# Patient Record
Sex: Male | Born: 1955 | Race: Black or African American | Hispanic: No | Marital: Single | Smoking: Former smoker
Health system: Southern US, Community
[De-identification: ages and names within clinical notes are randomized; demographics above are authoritative.]

## PROBLEM LIST (undated history)

## (undated) DIAGNOSIS — F1010 Alcohol abuse, uncomplicated: Secondary | ICD-10-CM

---

## 2015-07-14 ENCOUNTER — Emergency Department (HOSPITAL_COMMUNITY)
Admission: EM | Admit: 2015-07-14 | Discharge: 2015-07-15 | Disposition: A | Discharge status: Home / Self Care | Payer: Self-pay | Attending: Emergency Medicine | Admitting: Emergency Medicine

## 2015-07-14 ENCOUNTER — Encounter (HOSPITAL_COMMUNITY): Payer: Self-pay | Admitting: Oncology

## 2015-07-14 DIAGNOSIS — F1024 Alcohol dependence with alcohol-induced mood disorder: Secondary | ICD-10-CM | POA: Insufficient documentation

## 2015-07-14 DIAGNOSIS — F1094 Alcohol use, unspecified with alcohol-induced mood disorder: Secondary | ICD-10-CM | POA: Diagnosis present

## 2015-07-14 DIAGNOSIS — Z87891 Personal history of nicotine dependence: Secondary | ICD-10-CM | POA: Insufficient documentation

## 2015-07-14 DIAGNOSIS — R45850 Homicidal ideations: Secondary | ICD-10-CM

## 2015-07-14 DIAGNOSIS — F10230 Alcohol dependence with withdrawal, uncomplicated: Secondary | ICD-10-CM | POA: Diagnosis present

## 2015-07-14 DIAGNOSIS — R440 Auditory hallucinations: Secondary | ICD-10-CM | POA: Insufficient documentation

## 2015-07-14 DIAGNOSIS — F1010 Alcohol abuse, uncomplicated: Secondary | ICD-10-CM

## 2015-07-14 HISTORY — DX: Alcohol abuse, uncomplicated: F10.10

## 2015-07-14 LAB — RAPID URINE DRUG SCREEN, HOSP PERFORMED
AMPHETAMINES: NOT DETECTED
Barbiturates: NOT DETECTED
Benzodiazepines: NOT DETECTED
Cocaine: NOT DETECTED
OPIATES: NOT DETECTED
TETRAHYDROCANNABINOL: NOT DETECTED

## 2015-07-14 LAB — COMPREHENSIVE METABOLIC PANEL
ALBUMIN: 4.7 g/dL (ref 3.5–5.0)
ALT: 24 U/L (ref 17–63)
AST: 27 U/L (ref 15–41)
Alkaline Phosphatase: 83 U/L (ref 38–126)
Anion gap: 10 (ref 5–15)
BUN: 21 mg/dL — AB (ref 6–20)
CHLORIDE: 106 mmol/L (ref 101–111)
CO2: 27 mmol/L (ref 22–32)
CREATININE: 0.94 mg/dL (ref 0.61–1.24)
Calcium: 9.4 mg/dL (ref 8.9–10.3)
GFR calc non Af Amer: >60 mL/min (ref >60)
Glucose, Bld: 135 mg/dL — ABNORMAL HIGH (ref 65–99)
Potassium: 4 mmol/L (ref 3.5–5.1)
SODIUM: 143 mmol/L (ref 135–145)
Total Bilirubin: 0.8 mg/dL (ref 0.3–1.2)
Total Protein: 7.8 g/dL (ref 6.5–8.1)

## 2015-07-14 LAB — CBC
HCT: 39.3 % (ref 39.0–52.0)
HEMOGLOBIN: 13.3 g/dL (ref 13.0–17.0)
MCH: 30.6 pg (ref 26.0–34.0)
MCHC: 33.8 g/dL (ref 30.0–36.0)
MCV: 90.3 fL (ref 78.0–100.0)
Platelets: 270 10*3/uL (ref 150–400)
RBC: 4.35 MIL/uL (ref 4.22–5.81)
RDW: 13.1 % (ref 11.5–15.5)
WBC: 5 10*3/uL (ref 4.0–10.5)

## 2015-07-14 LAB — ACETAMINOPHEN LEVEL: Acetaminophen (Tylenol), Serum: <10 ug/mL — ABNORMAL LOW (ref 10–30)

## 2015-07-14 LAB — ETHANOL: Alcohol, Ethyl (B): 261 mg/dL — ABNORMAL HIGH (ref <5)

## 2015-07-14 LAB — SALICYLATE LEVEL

## 2015-07-14 MED ORDER — ZOLPIDEM TARTRATE 5 MG PO TABS: 5.0000 mg | ORAL_TABLET | Freq: Every evening | ORAL | Status: DC | PRN

## 2015-07-14 MED ORDER — IBUPROFEN 200 MG PO TABS: 600.0000 mg | ORAL_TABLET | Freq: Three times a day (TID) | ORAL | Status: DC | PRN

## 2015-07-14 MED ORDER — LORAZEPAM 1 MG PO TABS: 1.0000 mg | ORAL_TABLET | Freq: Three times a day (TID) | ORAL | Status: DC | PRN

## 2015-07-14 MED ORDER — ONDANSETRON HCL 4 MG PO TABS: 4.0000 mg | ORAL_TABLET | Freq: Three times a day (TID) | ORAL | Status: DC | PRN

## 2015-07-14 MED ORDER — ALUM & MAG HYDROXIDE-SIMETH 200-200-20 MG/5ML PO SUSP: 30.0000 mL | ORAL | Status: DC | PRN
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
## 2015-07-14 NOTE — ED Notes (Signed)
Pt brought in by [employer] d/t pt having homicidal thoughts towards his brother.  Pt does not have a plan in place to complete homicide at this time.  ETOH on board.

## 2015-07-14 NOTE — ED Provider Notes (Signed)
CSN: [id number]     Arrival date & time 07/14/15  2118 History   First MD Initiated Contact with Patient 07/14/15 2205     Chief Complaint  Patient presents with  . Homicidal   Garrett Holt is a 59 y.o. male with history of alcohol abuse who presents to the emergency department with [facility] reporting that he has homicidal ideations towards his brother tonight. Patient admits to drinking alcohol tonight. He reports he was recently out of jail. He reports he is angry at his brother for a nonspecified reasons and has thoughts about wanting to hurt him. He denies any specific plan on how he might kill him. The patient also endorses auditory hallucinations with command hallucinations telling him to hurt his brother. He denies visual hallucinations. He reports 1 previous suicide attempt in the 1970s but denies any previous mental health admission.  The patient denies visual hallucinations or suicidal ideations. The patient denies fevers, chills, abdominal pain, nausea, vomiting, chest pain, coughing, shortness of breath or rashes. He denies taking any medications in overdose tonight. He denies any illicit drug use. He denies any chronic illnesses or taking any medications on a regular basis.  (Consider location/radiation/quality/duration/timing/severity/associated sxs/prior Treatment) HPI  Past Medical History  Diagnosis Date  . ETOH abuse    History reviewed. No pertinent past surgical history. No family history on file. Social History  Substance Use Topics  . Smoking status: Former Games developer  . Smokeless tobacco: Never Used  . Alcohol Use: Yes    Review of Systems  Constitutional: Negative for fever and chills.  HENT: Negative for congestion and sore throat.   Eyes: Negative for visual disturbance.  Respiratory: Negative for cough and shortness of breath.   Cardiovascular: Negative for chest pain and palpitations.  Gastrointestinal: Negative for nausea, vomiting, abdominal  pain and diarrhea.  Genitourinary: Negative for dysuria.  Musculoskeletal: Negative for neck pain.  Skin: Negative for rash.  Neurological: Negative for headaches.  Psychiatric/Behavioral: Negative for suicidal ideas.      Allergies  Review of patient's allergies indicates no known allergies.  Home Medications   Prior to Admission medications   Not on File   BP 104/81 mmHg  Pulse 95  Temp(Src) 98.3 F (36.8 C) (Oral)  Resp 20  Ht  (1.753 m)  Wt 65.772 kg  BMI 21.40 kg/m2  SpO2 98% Physical Exam  Constitutional: He is oriented to person, place, and time. He appears well-developed and well-nourished. No distress.  HENT:  Head: Normocephalic and atraumatic.  Eyes: Pupils are equal, round, and reactive to light. Right eye exhibits no discharge. Left eye exhibits no discharge.  Cardiovascular: Normal rate, regular rhythm, normal heart sounds and intact distal pulses.   Pulmonary/Chest: Effort normal and breath sounds normal. No respiratory distress. He has no wheezes. He has no rales.  Abdominal: Soft. There is no tenderness.  Neurological: He is alert and oriented to person, place, and time. Coordination normal.  He is alert and oriented x 3.   Skin: Skin is warm and dry. No rash noted. He is not diaphoretic. No erythema. No pallor.  Psychiatric: He has a normal mood and affect. His speech is normal. Thought content is not paranoid. He expresses homicidal ideation. He expresses no suicidal ideation. He expresses no homicidal plans.  Patient endorses homicidal ideations towards his brother tonight. He makes good eye contact. He denies suicidal ideations. He denies a plan for hurting his brother. I question if the patient is fine to  internal stimuli. He endorses command auditory hallucinations. He denies visual hallucinations.  Nursing note and vitals reviewed.   ED Course  Procedures (including critical care time) Labs Review Labs Reviewed  COMPREHENSIVE METABOLIC PANEL  - Abnormal; Notable for the following:    Glucose, Bld 135 (*)    BUN 21 (*)    All other components within normal limits  ETHANOL - Abnormal; Notable for the following:    Alcohol, Ethyl (B) 261 (*)    All other components within normal limits  ACETAMINOPHEN LEVEL - Abnormal; Notable for the following:    Acetaminophen (Tylenol), Serum <10 (*)    All other components within normal limits  SALICYLATE LEVEL  CBC  URINE RAPID DRUG SCREEN, HOSP PERFORMED    Imaging Review No results found. I have personally reviewed and evaluated these lab results as part of my medical decision-making.   EKG Interpretation None      Filed Vitals:   07/14/15 2126  BP: 104/81  Pulse: 95  Temp: 98.3 F (36.8 C)  TempSrc: Oral  Resp: 20  Height: 5\' 9"  (1.753 m)  Weight: 65.772 kg  SpO2: 98%     MDM   Final diagnoses:  Homicidal ideations  Alcohol abuse   This  is a 59 y.o. male with history of alcohol abuse who [facility] reporting that he has homicidal ideations towards his brother tonight. Patient admits to drinking alcohol tonight. He reports he was recently out of jail. He reports he is angry at his brother for a nonspecified reasons and has thoughts about wanting to hurt him. He denies any specific plan on how he might kill him. The patient also endorses auditory hallucinations with command hallucinations telling him to hurt his brother. He denies visual hallucinations.   On exam the patient is afebrile and nontoxic appearing. He endorses homicidal ideations towards his brother without a plan. He is alert and oriented 3. He appears slightly intoxicated. He has a slight small alcohol. He denies suicidal ideations. He endorses drinking alcohol tonight. The patient's alcohol level is 261. He has the negative salicylate, and acetaminophen levels. CBC and CMP are unremarkable. Urine drug screen is still in process. Patient is medically  cleared for behavioral health admission.  CIWA protocol ordered. Psych hold orders placed.  Behavioral Health reports the patient meets inpatient criteria and they are looking for a bed for him currently.     Everlene FarrierWilliam Uday Jantz, PA-C 07/14/15 2304  Richardean Canalavid H Yao, MD 07/14/15 [phone]

## 2015-07-14 NOTE — BH Assessment (Signed)
Assessment completed. Consulted Hulan FessIjeoma Nwaeze, NP who recommended inpatient treatment. Informed Everlene FarrierWilliam Dansie, PA-C of recommendation. TTS will seek placement.

## 2015-07-14 NOTE — ED Notes (Signed)
Pt. To [facility] from ED ambulatory without difficulty, to room 37. Pt. Is alert and oriented, warm and dry in no distress. Pt. Denies SI, and AVH. Pt. States he wants to hurt his brother. Pt. Calm and cooperative. Pt. Made aware of security cameras and Q15 minute rounds. Pt. Encouraged to let Nursing staff know of any concerns or needs.

## 2015-07-15 DIAGNOSIS — F1094 Alcohol use, unspecified with alcohol-induced mood disorder: Secondary | ICD-10-CM | POA: Diagnosis present

## 2015-07-15 DIAGNOSIS — F10230 Alcohol dependence with withdrawal, uncomplicated: Secondary | ICD-10-CM | POA: Diagnosis present

## 2015-07-15 NOTE — ED Notes (Signed)
Pt. Noted sleeping in room. No complaints or concerns voiced. No distress or abnormal behavior noted. Will continue to monitor with security cameras. Q 15 minute rounds continue. 

## 2015-07-15 NOTE — ED Notes (Addendum)
Written dc instructions reviewed w/ pt.  Pt encouraged to follow up w/ OP alcohol treatment programs.  Pt verbalized understanding.  Pt ambulatory w/o difficulty to dc area, belongings returned after leaving the area.

## 2015-07-15 NOTE — BHH Suicide Risk Assessment (Signed)
Suicide Risk Assessment  Discharge Assessment   [facility]   Demographic Factors:  Male  Total Time spent with patient: 45 minutes   Musculoskeletal: Strength & Muscle Tone: within normal limits Gait & Station: normal Patient leans: N/A  Psychiatric Specialty Exam: Review of Systems  Constitutional: Negative.   HENT: Negative.   Eyes: Negative.   Respiratory: Negative.   Cardiovascular: Negative.   Gastrointestinal: Negative.   Genitourinary: Negative.   Musculoskeletal: Negative.   Skin: Negative.   Neurological: Negative.   Endo/Heme/Allergies: Negative.   Psychiatric/Behavioral: Positive for substance abuse.    Blood pressure 104/81, pulse 95, temperature 98.3 F (36.8 C), temperature source Oral, resp. rate 20, height 5\' 9"  (1.753 m), weight 65.772 kg (145 lb), SpO2 98 %.Body mass index is 21.4 kg/(m^2).  General Appearance: Casual  Eye Contact::  Good  Speech:  Normal Rate  Volume:  Normal  Mood:  Euthymic  Affect:  Congruent  Thought Process:  Coherent  Orientation:  Full (Time, Place, and Person)  Thought Content:  WDL  Suicidal Thoughts:  No  Homicidal Thoughts:  No  Memory:  Immediate;   Good Recent;   Good Remote;   Good  Judgement:  Fair  Insight:  Fair  Psychomotor Activity:  Normal  Concentration:  Good  Recall:  Good  Fund of Knowledge:Good  Language: Good  Akathisia:  No  Handed:  Right  AIMS (if indicated):     Assets:  Housing Leisure Time Physical Health Resilience Social Support  ADL's:  Intact  Cognition: WNL  Sleep:      Has this patient used any form of tobacco in the last 30 days? (Cigarettes, Smokeless Tobacco, Cigars, and/or Pipes) Yes, A prescription for an FDA-approved tobacco cessation medication was offered at discharge and the patient refused  Mental Status Per Nursing Assessment::   On Admission:   Alcohol intoxication  Current Mental Status by Physician: NA  Loss  Factors: NA  Historical Factors: Family history of mental illness or substance abuse  Risk Reduction Factors:   Sense of responsibility to family, Living with another person, especially a relative and Positive social support  Continued Clinical Symptoms:  None  Cognitive Features That Contribute To Risk:  None    Suicide Risk:  Minimal: No identifiable suicidal ideation.  Patients presenting with no risk factors but with morbid ruminations; may be classified as minimal risk based on the severity of the depressive symptoms  Principal Problem: Alcohol-induced mood disorder [facility](HCC) Discharge Diagnoses:  Patient Active Problem List   Diagnosis Date Noted  . Alcohol dependence with uncomplicated withdrawal (HCC) [F10.230] 07/15/2015    Priority: High  . Alcohol-induced mood disorder (HCC) [F10.94] 07/15/2015    Priority: High      Plan Of Care/Follow-up recommendations:  Activity:  as tolerated Diet:  heart healthy diet  Is patient on multiple antipsychotic therapies at discharge:  No   Has Patient had three or more failed trials of antipsychotic monotherapy by history:  No  Recommended Plan for Multiple Antipsychotic Therapies: NA    LORD, JAMISON, PMH-NP 07/15/2015, 10:41 AM
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
## 2016-08-03 ENCOUNTER — Encounter (HOSPITAL_COMMUNITY): Payer: Self-pay | Admitting: Family Medicine

## 2016-08-03 ENCOUNTER — Ambulatory Visit (HOSPITAL_COMMUNITY)
Admission: EM | Admit: 2016-08-03 | Discharge: 2016-08-03 | Disposition: A | Discharge status: Home / Self Care | Payer: Self-pay | Attending: Internal Medicine | Admitting: Internal Medicine

## 2016-08-03 DIAGNOSIS — R079 Chest pain, unspecified: Secondary | ICD-10-CM

## 2016-08-03 NOTE — ED Provider Notes (Signed)
CSN: [id number]     Arrival date & time 08/03/16  1206 History   None    Chief Complaint  Patient presents with  . Jaw Pain  . Chest Pain   (Consider location/radiation/quality/duration/timing/severity/associated sxs/prior Treatment) Patient is a well-appearing 61 year old male, with history of alcohol abuse, presents today for chest pain. Patient drinks 3 days out of the week, drinks 2 of 24oz can beer along with "some" liquor. Patient is also former smoker. He denies any current medical history. He does not have PCP currently.   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 Symptoms lasted for 1 minute and went away. Reports the symptom has not returned since that episode. Patient is currently asymptomatic. Patients denies any cardiac history. Patient did not seek any medical attention at the time. Patient decided to get evaluated today because it was mandatory by his employer.   Patient complained of the right hand pain to the triage nurse, patient currently denies hand pain or any trouble with his right hand.      Past Medical History:  Diagnosis Date  . ETOH abuse    History reviewed. No pertinent surgical history. History reviewed. No pertinent family history. Social History  Substance Use Topics  . Smoking status: Former Games developermoker  . Smokeless tobacco: Never Used  . Alcohol use Yes    Review of Systems  Constitutional:       As stated in the history of present illness. Patient is currently asymptomatic without chest pain, dizziness, shortness of breath, headache, blurred vision.     Allergies  Patient has no known allergies.  Home Medications   Prior to Admission medications   Not on File   Meds Ordered and Administered this Visit  Medications - No data to display  BP 149/97   Pulse 90   Temp 98.4 F (36.9 C)   Resp 18   SpO2 99%   No data found.   Physical Exam  Constitutional: He is oriented to person, place, and time. He appears well-developed and well-nourished. No distress.  HENT:  Head: Normocephalic and atraumatic.  Right Ear: External ear normal.  Left Ear: External ear normal.  Nose: Nose normal.  Mouth/Throat: Oropharynx is clear and moist. No oropharyngeal exudate.  Eyes: EOM are normal. Pupils are equal, round, and reactive to light.  Neck: Normal range of motion. Neck supple.  Cardiovascular: Normal rate, regular rhythm and normal heart sounds.   Pulmonary/Chest: Effort normal and breath sounds normal.  Abdominal: Soft. Bowel sounds are normal. He exhibits no distension. There is no tenderness.  Musculoskeletal: Normal range of motion.  Neurological: He is alert and oriented to person, place, and time.  Skin: Skin is warm and dry. He is not diaphoretic.  Nursing note and vitals reviewed.   Urgent Care Course   Clinical Course     Procedures (including critical care time)  Labs Review Labs Reviewed - No data to display  Imaging Review No results found.  MDM   1. Chest pain, unspecified type    Physical examination unremarkable. EKG shows occasional unifocal PVCs. Patient is asymptomatic currently. Vital signs are appropriate. I think it is reasonable to have patient follow up with cardiologist outpatient. [facility] [address]. Patient informed to make an appointments with the cardiologist as soon as possible. Informed to go  to ER if his chest pain, dizziness, SOB or headache returns.    Lucia Estelle, NP 08/03/16 1441

## 2016-08-03 NOTE — ED Triage Notes (Signed)
Pt here for episode of bilateral jaw pain, throat pain and chest pain that lasted about a few minutes on Monday. sts no problems since. sts also right hand pain and trouble using hand.

## 2016-08-03 NOTE — Discharge Instructions (Signed)
It is highly recommended that you see a cardiologist outpatient to have this chest pain further evaluated. If the chest pain returns, please go to the emergency department.

## 2018-06-19 ENCOUNTER — Emergency Department (HOSPITAL_BASED_OUTPATIENT_CLINIC_OR_DEPARTMENT_OTHER): Payer: Non-veteran care

## 2018-06-19 ENCOUNTER — Emergency Department (HOSPITAL_COMMUNITY): Payer: Non-veteran care

## 2018-06-19 ENCOUNTER — Other Ambulatory Visit: Payer: Self-pay

## 2018-06-19 ENCOUNTER — Emergency Department (HOSPITAL_COMMUNITY)
Admission: EM | Admit: 2018-06-19 | Discharge: 2018-06-19 | Disposition: A | Discharge status: Home / Self Care | Payer: Non-veteran care | Attending: Emergency Medicine | Admitting: Emergency Medicine

## 2018-06-19 ENCOUNTER — Encounter (HOSPITAL_COMMUNITY): Payer: Self-pay

## 2018-06-19 DIAGNOSIS — R52 Pain, unspecified: Secondary | ICD-10-CM

## 2018-06-19 DIAGNOSIS — M7989 Other specified soft tissue disorders: Secondary | ICD-10-CM | POA: Diagnosis not present

## 2018-06-19 DIAGNOSIS — Z87891 Personal history of nicotine dependence: Secondary | ICD-10-CM | POA: Insufficient documentation

## 2018-06-19 DIAGNOSIS — R609 Edema, unspecified: Secondary | ICD-10-CM

## 2018-06-19 DIAGNOSIS — R2242 Localized swelling, mass and lump, left lower limb: Secondary | ICD-10-CM | POA: Insufficient documentation

## 2018-06-19 LAB — CBC WITH DIFFERENTIAL/PLATELET
Basophils Absolute: 0.1 10*3/uL (ref 0.0–0.1)
Basophils Relative: 2 %
Eosinophils Absolute: 0.1 10*3/uL (ref 0.0–0.5)
Eosinophils Relative: 2 %
HEMATOCRIT: 37.5 % — AB (ref 39.0–52.0)
HEMOGLOBIN: 12.2 g/dL — AB (ref 13.0–17.0)
Lymphocytes Relative: 20 %
Lymphs Abs: 1.2 10*3/uL (ref 0.7–4.0)
MCH: 31.3 pg (ref 26.0–34.0)
MCHC: 32.5 g/dL (ref 30.0–36.0)
MCV: 96.2 fL (ref 80.0–100.0)
Monocytes Absolute: 0.2 10*3/uL (ref 0.1–1.0)
Monocytes Relative: 4 %
NEUTROS ABS: 4.5 10*3/uL (ref 1.7–7.7)
NEUTROS PCT: 72 %
Platelets: 344 10*3/uL (ref 150–400)
RBC: 3.9 MIL/uL — ABNORMAL LOW (ref 4.22–5.81)
RDW: 12.1 % (ref 11.5–15.5)
WBC: 6.2 10*3/uL (ref 4.0–10.5)
nRBC: 0 % (ref 0.0–0.2)

## 2018-06-19 LAB — COMPREHENSIVE METABOLIC PANEL
ALT: 16 U/L (ref 0–44)
AST: 32 U/L (ref 15–41)
Albumin: 3.8 g/dL (ref 3.5–5.0)
Alkaline Phosphatase: 71 U/L (ref 38–126)
Anion gap: 10 (ref 5–15)
BILIRUBIN TOTAL: 1.1 mg/dL (ref 0.3–1.2)
BUN: 17 mg/dL (ref 8–23)
CALCIUM: 9.4 mg/dL (ref 8.9–10.3)
CO2: 25 mmol/L (ref 22–32)
Chloride: 102 mmol/L (ref 98–111)
Creatinine, Ser: 0.85 mg/dL (ref 0.61–1.24)
GFR calc Af Amer: >60 mL/min (ref >60)
GFR calc non Af Amer: >60 mL/min (ref >60)
Glucose, Bld: 93 mg/dL (ref 70–99)
Potassium: 4.6 mmol/L (ref 3.5–5.1)
Sodium: 137 mmol/L (ref 135–145)
Total Protein: 7 g/dL (ref 6.5–8.1)

## 2018-06-19 MED ORDER — CLINDAMYCIN HCL 300 MG PO CAPS: 300.0000 mg | ORAL_CAPSULE | Freq: Four times a day (QID) | ORAL | 0 refills | Status: DC

## 2018-06-19 MED ORDER — CLINDAMYCIN HCL 150 MG PO CAPS
300.0000 mg | ORAL_CAPSULE | Freq: Once | ORAL | Status: AC
  Administered 2018-06-19: 300 mg via ORAL
  Filled 2018-06-19: qty 2

## 2018-06-19 MED ORDER — PREDNISONE 20 MG PO TABS: ORAL_TABLET | ORAL | 0 refills | Status: DC

## 2018-06-19 MED ORDER — HYDROCODONE-ACETAMINOPHEN 5-325 MG PO TABS: 1.0000 | ORAL_TABLET | Freq: Four times a day (QID) | ORAL | 0 refills | Status: DC | PRN

## 2018-06-19 MED ORDER — HYDROCODONE-ACETAMINOPHEN 5-325 MG PO TABS
1.0000 | ORAL_TABLET | Freq: Once | ORAL | Status: AC
  Administered 2018-06-19: 1 via ORAL
  Filled 2018-06-19: qty 1

## 2018-06-19 MED ORDER — PREDNISONE 20 MG PO TABS
60.0000 mg | ORAL_TABLET | Freq: Once | ORAL | Status: AC
  Administered 2018-06-19: 60 mg via ORAL
  Filled 2018-06-19: qty 3

## 2018-06-19 NOTE — Progress Notes (Signed)
Preliminary notes--Left lower extremity venous duplex exam completed. No evidence for deep and superficial veins thrombosis. ----  Incidental finding:  The area of concern where patient pointed most pain generated, a 4.78x3.63x3.52cm complex fluid collection seen at the distal thigh anteriorly. Etiology unknown.  Dominica Kent H Jerri Hargadon(RDMS RVT) 06/19/18 4:05 PM

## 2018-06-19 NOTE — ED Notes (Signed)
Light green, lavender, and blue top sent to Main Lab.  [facility].

## 2018-06-19 NOTE — ED Notes (Signed)
Patient transported to Ultrasound 

## 2018-06-19 NOTE — ED Notes (Signed)
Patient back from US.

## 2018-06-19 NOTE — ED Triage Notes (Signed)
Pt endorses left knee pain x10 days. [facility]. Swelling present to left knee and redness present to calf. Family reports pain is the worst by the knee. Pt denies any fall or injury to the leg.

## 2018-06-19 NOTE — ED Notes (Signed)
ED Provider at bedside. 

## 2018-06-19 NOTE — ED Provider Notes (Signed)
Emergency Department Provider Note   I have reviewed the triage vital signs and the nursing notes.   HISTORY  Chief Complaint Leg Swelling   HPI Garrett Holt is a 62 y.o. male who is not the best historian who presents to the emergency department today with leg pain.  Patient states that he has had probably between 3 and 10 days of progressively worsening left knee pain.  Seems to be worse on the medial side of the knee.  Has had some swelling and redness during that time as well he thinks it started the same time.  He went to [facility] to be evaluated and they sent him here.  Without any fevers, malaise nausea or vomiting. No chest pain, shortness of breath or syncope.  No other associated or modifying symptoms.    Past Medical History:  Diagnosis Date  . ETOH abuse     Patient Active Problem List   Diagnosis Date Noted  . Alcohol dependence with uncomplicated withdrawal (HCC) 07/15/2015  . Alcohol-induced mood disorder (HCC) 07/15/2015    History reviewed. No pertinent surgical history.    Allergies Patient has no known allergies.  No family history on file.  Social History Social History   Tobacco Use  . Smoking status: Former Games developermoker  . Smokeless tobacco: Never Used  Substance Use Topics  . Alcohol use: Yes  . Drug use: No    Review of Systems  All other systems negative except as documented in the HPI. All pertinent positives and negatives as reviewed in the HPI. ____________________________________________   PHYSICAL EXAM:  VITAL SIGNS: ED Triage Vitals  Enc Vitals Group     BP 06/19/18 1404 (!) 153/93     Pulse Rate 06/19/18 1404 88     Resp 06/19/18 1404 16     Temp 06/19/18 1404 98.3 F (36.8 C)     Temp Source 06/19/18 1404 Oral     SpO2 06/19/18 1404 100 %     Weight 06/19/18 1403 140 lb (63.5 kg)     Height 06/19/18 1403 5\' 9"  (1.753 m)    Constitutional: Alert and oriented. Well appearing and in no acute distress. Eyes:  Conjunctivae are normal. PERRL. EOMI. Head: Atraumatic. Nose: No congestion/rhinnorhea. Mouth/Throat: Mucous membranes are moist.  Oropharynx non-erythematous. Neck: No stridor.  No meningeal signs.   Cardiovascular: Normal rate, regular rhythm. Good peripheral circulation. Grossly normal heart sounds.   Respiratory: Normal respiratory effort.  No retractions. Lungs CTAB. Gastrointestinal: Soft and nontender. No distention.  Musculoskeletal: No lower extremity tenderness nor edema. No gross deformities of extremities. Neurologic:  Normal speech and language. No gross focal neurologic deficits are appreciated.  Skin:  Skin is warm, dry and intact. Edema to left leg to knee. Erythema mostly on medial side of lower leg as well down into medial left foot. Tender around medial side of left knee.   ____________________________________________   LABS (all labs ordered are listed, but only abnormal results are displayed)  Labs Reviewed  CBC WITH DIFFERENTIAL/PLATELET - Abnormal; Notable for the following components:      Result Value   RBC 3.90 (*)    Hemoglobin 12.2 (*)    HCT 37.5 (*)    All other components within normal limits  COMPREHENSIVE METABOLIC PANEL   ____________________________________________  RADIOLOGY  [place] Lt Lower Extrem Ltd Soft Tissue Non Vascular  Result Date: 06/19/2018 CLINICAL DATA:  Left knee pain and swelling for 10 days. EXAM: ULTRASOUND LEFT LOWER EXTREMITY LIMITED TECHNIQUE:  Ultrasound examination of the lower extremity soft tissues was performed in the area of clinical concern. COMPARISON:  None. FINDINGS: Scanning was directed toward the region of concern as indicated by the patient. There is a fluid collection in the subcutaneous tissues measuring 4.7 cm craniocaudal by 0.9 cm AP by 8 cm transverse. There is a small volume of debris within the collection. Overlying cutaneous tissues appear somewhat thickened. IMPRESSION: Subcutaneous fluid collection in the  region of concern could be due to bursitis, seroma or abscess in the appropriate clinical setting. The collection should be easily amenable to aspiration. Electronically Signed   By: Drusilla Kanner M.D.   On: 06/19/2018 18:59    ____________________________________________   INITIAL IMPRESSION / ASSESSMENT AND PLAN / ED COURSE  Less likely abscess or infected bursitis without overlying erythema, cellulitis, induration. Could be seroma vs inflammatory bursitis, will dose steroids/pain meds. Ace wrap/heat/elevation.  Does have distal erythema in lower leg that could be cellulitis and will treat for same. No e/o DVT on [place].  Considered aspiration for fluid contents however with such a low suspicion for infection in that area, the risk is higher than the benefit.  Will follow up in 5-7 days with pcp to ensure improvement.   Pertinent labs & imaging results that were available during my care of the patient were reviewed by me and considered in my medical decision making (see chart for details).  ____________________________________________  FINAL CLINICAL IMPRESSION(S) / ED DIAGNOSES  Final diagnoses:  Leg swelling     MEDICATIONS GIVEN DURING THIS VISIT:  Medications  clindamycin (CLEOCIN) capsule 300 mg (has no administration in time range)  predniSONE (DELTASONE) tablet 60 mg (has no administration in time range)  HYDROcodone-acetaminophen (NORCO/VICODIN) 5-325 MG per tablet 1 tablet (1 tablet Oral Given 06/19/18 1410)     NEW OUTPATIENT MEDICATIONS STARTED DURING THIS VISIT:  New Prescriptions   CLINDAMYCIN (CLEOCIN) 300 MG CAPSULE    Take 1 capsule (300 mg total) by mouth 4 (four) times daily. X 7 days   HYDROCODONE-ACETAMINOPHEN (NORCO/VICODIN) 5-325 MG TABLET    Take 1-2 tablets by mouth every 6 (six) hours as needed for severe pain.   PREDNISONE (DELTASONE) 20 MG TABLET    3 tabs po daily x 3 days, then 2 tabs x 3 days, then 1.5 tabs x 3 days, then 1 tab x 3 days, then 0.5  tabs x 3 days    Note:  This note was prepared with assistance of Dragon voice recognition software. Occasional wrong-word or sound-a-like substitutions may have occurred due to the inherent limitations of voice recognition software.   Marily Memos, MD 06/19/18 1911

## 2019-04-13 ENCOUNTER — Other Ambulatory Visit: Payer: Self-pay

## 2019-04-13 ENCOUNTER — Emergency Department (HOSPITAL_COMMUNITY): Payer: No Typology Code available for payment source | Admitting: Certified Registered"

## 2019-04-13 ENCOUNTER — Ambulatory Visit (HOSPITAL_COMMUNITY)
Admission: EM | Admit: 2019-04-13 | Discharge: 2019-04-13 | Disposition: A | Discharge status: Home / Self Care | Payer: No Typology Code available for payment source | Attending: General Surgery | Admitting: General Surgery

## 2019-04-13 ENCOUNTER — Encounter (HOSPITAL_COMMUNITY)
Admission: EM | Disposition: A | Discharge status: Home / Self Care | Payer: Self-pay | Source: Home / Self Care | Attending: Emergency Medicine

## 2019-04-13 ENCOUNTER — Encounter (HOSPITAL_COMMUNITY): Payer: Self-pay | Admitting: Emergency Medicine

## 2019-04-13 ENCOUNTER — Emergency Department (HOSPITAL_COMMUNITY): Payer: No Typology Code available for payment source

## 2019-04-13 DIAGNOSIS — S62663A Nondisplaced fracture of distal phalanx of left middle finger, initial encounter for closed fracture: Secondary | ICD-10-CM | POA: Diagnosis not present

## 2019-04-13 DIAGNOSIS — S62667A Nondisplaced fracture of distal phalanx of left little finger, initial encounter for closed fracture: Secondary | ICD-10-CM | POA: Diagnosis not present

## 2019-04-13 DIAGNOSIS — W230XXA Caught, crushed, jammed, or pinched between moving objects, initial encounter: Secondary | ICD-10-CM | POA: Diagnosis not present

## 2019-04-13 DIAGNOSIS — S61419A Laceration without foreign body of unspecified hand, initial encounter: Secondary | ICD-10-CM

## 2019-04-13 DIAGNOSIS — S61314A Laceration without foreign body of right ring finger with damage to nail, initial encounter: Secondary | ICD-10-CM | POA: Insufficient documentation

## 2019-04-13 DIAGNOSIS — S61313A Laceration without foreign body of left middle finger with damage to nail, initial encounter: Secondary | ICD-10-CM | POA: Diagnosis present

## 2019-04-13 DIAGNOSIS — S62609B Fracture of unspecified phalanx of unspecified finger, initial encounter for open fracture: Secondary | ICD-10-CM

## 2019-04-13 DIAGNOSIS — Z87891 Personal history of nicotine dependence: Secondary | ICD-10-CM | POA: Insufficient documentation

## 2019-04-13 DIAGNOSIS — Z20828 Contact with and (suspected) exposure to other viral communicable diseases: Secondary | ICD-10-CM | POA: Diagnosis not present

## 2019-04-13 DIAGNOSIS — S62665A Nondisplaced fracture of distal phalanx of left ring finger, initial encounter for closed fracture: Secondary | ICD-10-CM | POA: Insufficient documentation

## 2019-04-13 DIAGNOSIS — S68121A Partial traumatic metacarpophalangeal amputation of left index finger, initial encounter: Secondary | ICD-10-CM | POA: Insufficient documentation

## 2019-04-13 HISTORY — PX: I & D EXTREMITY: SHX5045

## 2019-04-13 LAB — CBC
HCT: 33 % — ABNORMAL LOW (ref 39.0–52.0)
Hemoglobin: 11.9 g/dL — ABNORMAL LOW (ref 13.0–17.0)
MCH: 36.3 pg — ABNORMAL HIGH (ref 26.0–34.0)
MCHC: 36.1 g/dL — ABNORMAL HIGH (ref 30.0–36.0)
MCV: 100.6 fL — ABNORMAL HIGH (ref 80.0–100.0)
Platelets: 248 10*3/uL (ref 150–400)
RBC: 3.28 MIL/uL — ABNORMAL LOW (ref 4.22–5.81)
RDW: 14.6 % (ref 11.5–15.5)
WBC: 4.3 10*3/uL (ref 4.0–10.5)
nRBC: 0 % (ref 0.0–0.2)

## 2019-04-13 LAB — BASIC METABOLIC PANEL
Anion gap: 12 (ref 5–15)
BUN: 13 mg/dL (ref 8–23)
CO2: 22 mmol/L (ref 22–32)
Calcium: 8.4 mg/dL — ABNORMAL LOW (ref 8.9–10.3)
Chloride: 99 mmol/L (ref 98–111)
Creatinine, Ser: 0.86 mg/dL (ref 0.61–1.24)
GFR calc Af Amer: >60 mL/min (ref >60)
GFR calc non Af Amer: >60 mL/min (ref >60)
Glucose, Bld: 118 mg/dL — ABNORMAL HIGH (ref 70–99)
Potassium: 4.1 mmol/L (ref 3.5–5.1)
Sodium: 133 mmol/L — ABNORMAL LOW (ref 135–145)

## 2019-04-13 LAB — SARS CORONAVIRUS 2 BY RT PCR (HOSPITAL ORDER, PERFORMED IN ~~LOC~~ HOSPITAL LAB): SARS Coronavirus 2: NEGATIVE

## 2019-04-13 SURGERY — IRRIGATION AND DEBRIDEMENT EXTREMITY
Anesthesia: General | Site: Hand | Laterality: Left

## 2019-04-13 MED ORDER — MIDAZOLAM HCL 2 MG/2ML IJ SOLN
INTRAMUSCULAR | Status: AC
  Filled 2019-04-13: qty 2

## 2019-04-13 MED ORDER — OXYCODONE HCL 5 MG PO TABS: 5.0000 mg | ORAL_TABLET | Freq: Once | ORAL | Status: DC | PRN

## 2019-04-13 MED ORDER — CEFAZOLIN SODIUM-DEXTROSE 2-4 GM/100ML-% IV SOLN
INTRAVENOUS | Status: AC
  Filled 2019-04-13: qty 100

## 2019-04-13 MED ORDER — 0.9 % SODIUM CHLORIDE (POUR BTL) OPTIME
TOPICAL | Status: DC | PRN
  Administered 2019-04-13: 1000 mL

## 2019-04-13 MED ORDER — SUCCINYLCHOLINE CHLORIDE 200 MG/10ML IV SOSY
PREFILLED_SYRINGE | INTRAVENOUS | Status: DC | PRN
  Administered 2019-04-13: 100 mg via INTRAVENOUS

## 2019-04-13 MED ORDER — CHLORHEXIDINE GLUCONATE 4 % EX LIQD: 60.0000 mL | Freq: Once | CUTANEOUS | Status: DC

## 2019-04-13 MED ORDER — CEPHALEXIN 500 MG PO CAPS: 500.0000 mg | ORAL_CAPSULE | Freq: Four times a day (QID) | ORAL | 0 refills | Status: DC

## 2019-04-13 MED ORDER — DEXAMETHASONE SODIUM PHOSPHATE 10 MG/ML IJ SOLN
INTRAMUSCULAR | Status: AC
  Filled 2019-04-13: qty 1

## 2019-04-13 MED ORDER — LIDOCAINE 2% (20 MG/ML) 5 ML SYRINGE
INTRAMUSCULAR | Status: DC | PRN
  Administered 2019-04-13: 60 mg via INTRAVENOUS

## 2019-04-13 MED ORDER — PROPOFOL 10 MG/ML IV BOLUS
INTRAVENOUS | Status: DC | PRN
  Administered 2019-04-13: 150 mg via INTRAVENOUS

## 2019-04-13 MED ORDER — OXYCODONE-ACETAMINOPHEN 5-325 MG PO TABS
1.0000 | ORAL_TABLET | ORAL | Status: DC | PRN
  Administered 2019-04-13: 1 via ORAL
  Filled 2019-04-13: qty 1

## 2019-04-13 MED ORDER — FENTANYL CITRATE (PF) 250 MCG/5ML IJ SOLN
INTRAMUSCULAR | Status: AC
  Filled 2019-04-13: qty 5

## 2019-04-13 MED ORDER — CEFAZOLIN SODIUM-DEXTROSE 2-4 GM/100ML-% IV SOLN
2.0000 g | INTRAVENOUS | Status: AC
  Administered 2019-04-13: 19:00:00 2 g via INTRAVENOUS

## 2019-04-13 MED ORDER — PROPOFOL 10 MG/ML IV BOLUS
INTRAVENOUS | Status: AC
  Filled 2019-04-13: qty 20

## 2019-04-13 MED ORDER — HYDROMORPHONE HCL 1 MG/ML IJ SOLN: 0.2500 mg | INTRAMUSCULAR | Status: DC | PRN

## 2019-04-13 MED ORDER — LIDOCAINE 2% (20 MG/ML) 5 ML SYRINGE
INTRAMUSCULAR | Status: AC
  Filled 2019-04-13: qty 5

## 2019-04-13 MED ORDER — POVIDONE-IODINE 10 % EX SWAB: 2.0000 "application " | Freq: Once | CUTANEOUS | Status: DC

## 2019-04-13 MED ORDER — BUPIVACAINE HCL (PF) 0.25 % IJ SOLN
INTRAMUSCULAR | Status: AC
  Filled 2019-04-13: qty 30

## 2019-04-13 MED ORDER — MEPERIDINE HCL 25 MG/ML IJ SOLN: 6.2500 mg | INTRAMUSCULAR | Status: DC | PRN

## 2019-04-13 MED ORDER — ONDANSETRON HCL 4 MG/2ML IJ SOLN
INTRAMUSCULAR | Status: DC | PRN
  Administered 2019-04-13: 4 mg via INTRAVENOUS

## 2019-04-13 MED ORDER — GLYCOPYRROLATE PF 0.2 MG/ML IJ SOSY
PREFILLED_SYRINGE | INTRAMUSCULAR | Status: AC
  Filled 2019-04-13: qty 1

## 2019-04-13 MED ORDER — ONDANSETRON HCL 4 MG/2ML IJ SOLN
INTRAMUSCULAR | Status: AC
  Filled 2019-04-13: qty 2

## 2019-04-13 MED ORDER — LACTATED RINGERS IV SOLN
INTRAVENOUS | Status: DC
  Administered 2019-04-13: 19:00:00 via INTRAVENOUS

## 2019-04-13 MED ORDER — DEXAMETHASONE SODIUM PHOSPHATE 10 MG/ML IJ SOLN
INTRAMUSCULAR | Status: DC | PRN
  Administered 2019-04-13: 10 mg via INTRAVENOUS

## 2019-04-13 MED ORDER — OXYCODONE HCL 5 MG/5ML PO SOLN: 5.0000 mg | Freq: Once | ORAL | Status: DC | PRN

## 2019-04-13 MED ORDER — MIDAZOLAM HCL 5 MG/5ML IJ SOLN
INTRAMUSCULAR | Status: DC | PRN
  Administered 2019-04-13: 2 mg via INTRAVENOUS

## 2019-04-13 MED ORDER — CEFAZOLIN SODIUM-DEXTROSE 1-4 GM/50ML-% IV SOLN
1.0000 g | Freq: Once | INTRAVENOUS | Status: AC
  Administered 2019-04-13: 1 g via INTRAVENOUS
  Filled 2019-04-13: qty 50

## 2019-04-13 MED ORDER — FENTANYL CITRATE (PF) 250 MCG/5ML IJ SOLN
INTRAMUSCULAR | Status: DC | PRN
  Administered 2019-04-13: 100 ug via INTRAVENOUS
  Administered 2019-04-13 (×2): 50 ug via INTRAVENOUS
  Administered 2019-04-13: 100 ug via INTRAVENOUS

## 2019-04-13 MED ORDER — ROCURONIUM BROMIDE 50 MG/5ML IV SOSY
PREFILLED_SYRINGE | INTRAVENOUS | Status: DC | PRN
  Administered 2019-04-13: 10 mg via INTRAVENOUS

## 2019-04-13 MED ORDER — FENTANYL CITRATE (PF) 100 MCG/2ML IJ SOLN
50.0000 ug | Freq: Once | INTRAMUSCULAR | Status: AC
  Administered 2019-04-13: 50 ug via INTRAVENOUS
  Filled 2019-04-13: qty 2

## 2019-04-13 MED ORDER — PROMETHAZINE HCL 25 MG/ML IJ SOLN: 6.2500 mg | INTRAMUSCULAR | Status: DC | PRN

## 2019-04-13 MED ORDER — HYDROCODONE-ACETAMINOPHEN 5-325 MG PO TABS: 1.0000 | ORAL_TABLET | ORAL | 0 refills | Status: AC | PRN

## 2019-04-13 MED ORDER — SODIUM CHLORIDE 0.9 % IV SOLN
INTRAVENOUS | Status: AC
  Filled 2019-04-13: qty 500000

## 2019-04-13 MED ORDER — LACTATED RINGERS IV SOLN: INTRAVENOUS | Status: DC

## 2019-04-13 SURGICAL SUPPLY — 47 items
BAG DECANTER FOR FLEXI CONT (MISCELLANEOUS) ×3 IMPLANT
BNDG COHESIVE 1X5 TAN STRL LF (GAUZE/BANDAGES/DRESSINGS) ×3 IMPLANT
BNDG CONFORM 2 STRL LF (GAUZE/BANDAGES/DRESSINGS) ×3 IMPLANT
BNDG ELASTIC 3X5.8 VLCR STR LF (GAUZE/BANDAGES/DRESSINGS) IMPLANT
BNDG ELASTIC 4X5.8 VLCR STR LF (GAUZE/BANDAGES/DRESSINGS) IMPLANT
BNDG GAUZE ELAST 4 BULKY (GAUZE/BANDAGES/DRESSINGS) IMPLANT
BNDG STRETCH 4X75 NS LF (GAUZE/BANDAGES/DRESSINGS) ×3 IMPLANT
CORD BIPOLAR FORCEPS 12FT (ELECTRODE) IMPLANT
COVER WAND RF STERILE (DRAPES) ×3 IMPLANT
CUFF TOURN SGL QUICK 18X4 (TOURNIQUET CUFF) IMPLANT
DRAPE SURG 17X23 STRL (DRAPES) ×3 IMPLANT
DRSG XEROFORM 1X8 (GAUZE/BANDAGES/DRESSINGS) ×3 IMPLANT
ELECT REM PT RETURN 9FT ADLT (ELECTROSURGICAL)
ELECTRODE REM PT RTRN 9FT ADLT (ELECTROSURGICAL) IMPLANT
GAUZE PACKING IODOFORM 1/4X15 (GAUZE/BANDAGES/DRESSINGS) IMPLANT
GAUZE SPONGE 4X4 12PLY STRL (GAUZE/BANDAGES/DRESSINGS) ×3 IMPLANT
GAUZE XEROFORM 1X8 LF (GAUZE/BANDAGES/DRESSINGS) ×3 IMPLANT
GAUZE XEROFORM 5X9 LF (GAUZE/BANDAGES/DRESSINGS) ×3 IMPLANT
GLOVE BIOGEL M 8.0 STRL (GLOVE) ×3 IMPLANT
GOWN STRL REUS W/ TWL LRG LVL3 (GOWN DISPOSABLE) ×2 IMPLANT
GOWN STRL REUS W/TWL LRG LVL3 (GOWN DISPOSABLE) ×4
HANDPIECE INTERPULSE COAX TIP (DISPOSABLE)
IV NS IRRIG 3000ML ARTHROMATIC (IV SOLUTION) IMPLANT
KIT BASIN OR (CUSTOM PROCEDURE TRAY) ×3 IMPLANT
KIT TURNOVER KIT B (KITS) ×3 IMPLANT
MANIFOLD NEPTUNE II (INSTRUMENTS) ×3 IMPLANT
MATRIX WOUND MESHED 2X2 (Tissue) ×1 IMPLANT
NEEDLE HYPO 25GX1X1/2 BEV (NEEDLE) IMPLANT
NS IRRIG 1000ML POUR BTL (IV SOLUTION) ×3 IMPLANT
PACK ORTHO EXTREMITY (CUSTOM PROCEDURE TRAY) ×3 IMPLANT
PAD ARMBOARD 7.5X6 YLW CONV (MISCELLANEOUS) ×6 IMPLANT
PAD CAST 4YDX4 CTTN HI CHSV (CAST SUPPLIES) IMPLANT
PADDING CAST COTTON 4X4 STRL (CAST SUPPLIES)
SET CYSTO W/LG BORE CLAMP LF (SET/KITS/TRAYS/PACK) IMPLANT
SET HNDPC FAN SPRY TIP SCT (DISPOSABLE) IMPLANT
SOAP 2 % CHG 4 OZ (WOUND CARE) ×3 IMPLANT
SPONGE LAP 18X18 RF (DISPOSABLE) IMPLANT
SPONGE LAP 4X18 RFD (DISPOSABLE) ×3 IMPLANT
SWAB CULTURE ESWAB REG 1ML (MISCELLANEOUS) IMPLANT
SYR CONTROL 10ML LL (SYRINGE) IMPLANT
TOWEL GREEN STERILE (TOWEL DISPOSABLE) ×3 IMPLANT
TOWEL GREEN STERILE FF (TOWEL DISPOSABLE) ×3 IMPLANT
TUBE CONNECTING 12'X1/4 (SUCTIONS) ×1
TUBE CONNECTING 12X1/4 (SUCTIONS) ×2 IMPLANT
WATER STERILE IRR 1000ML POUR (IV SOLUTION) ×3 IMPLANT
WOUND MATRIX MESHED 2X2 (Tissue) ×2 IMPLANT
YANKAUER SUCT BULB TIP NO VENT (SUCTIONS) ×3 IMPLANT

## 2019-04-13 NOTE — ED Provider Notes (Signed)
[facility] EMERGENCY DEPARTMENT Provider Note   CSN: [id number] Arrival date & time: 04/13/19  1140     History   Chief Complaint Chief Complaint  Patient presents with   Finger Injury   Laceration    HPI Garrett Holt is a 63 y.o. male with a hx of EtOH abuse who presents to the ED w/ complaints of L hand injury which occurred @ 10:30 AM today. Patient states he was at work when his L hand got caught in the metal portion of the conveyor belt.  He states this resulted in injury which included lacerations to multiple digits.  States he is having a throbbing pain that is moderate in severity, no alleviating or aggravating factors, no intervention prior to arrival.  He denies numbness, tingling, or weakness.  He is right-hand dominant.  Last tetanus was within the past 1 year.  He states he last ate food around 730 this morning and has been drinking water while in the waiting room.     HPI  Past Medical History:  Diagnosis Date   ETOH abuse     Patient Active Problem List   Diagnosis Date Noted   Alcohol dependence with uncomplicated withdrawal (HCC) 07/15/2015   Alcohol-induced mood disorder (HCC) 07/15/2015    History reviewed. No pertinent surgical history.      Home Medications    Prior to Admission medications   Medication Sig Start Date End Date Taking? Authorizing Provider  clindamycin (CLEOCIN) 300 MG capsule Take 1 capsule (300 mg total) by mouth 4 (four) times daily. X 7 days 06/19/18   Mesner, Barbara Cower, MD  HYDROcodone-acetaminophen (NORCO/VICODIN) 5-325 MG tablet Take 1-2 tablets by mouth every 6 (six) hours as needed for severe pain. 06/19/18   Mesner, Barbara Cower, MD  predniSONE (DELTASONE) 20 MG tablet 3 tabs po daily x 3 days, then 2 tabs x 3 days, then 1.5 tabs x 3 days, then 1 tab x 3 days, then 0.5 tabs x 3 days 06/19/18   Mesner, Barbara Cower, MD    Family History No family history on file.  Social History Social History   Tobacco Use    Smoking status: Former Smoker   Smokeless tobacco: Never Used  Substance Use Topics   Alcohol use: Yes   Drug use: No     Allergies   Patient has no known allergies.   Review of Systems Review of Systems  Constitutional: Negative for chills and fever.  Respiratory: Negative for shortness of breath.   Cardiovascular: Negative for chest pain.  Gastrointestinal: Negative for vomiting.  Musculoskeletal: Positive for arthralgias and myalgias.  Skin: Positive for wound.  Neurological: Negative for weakness and numbness.  All other systems reviewed and are negative.    Physical Exam Updated Vital Signs BP 110/66 (BP Location: Right Arm)    Pulse 73    Temp 98.5 F (36.9 C) (Oral)    Resp 16    SpO2 96%   Physical Exam Vitals signs and nursing note reviewed.  Constitutional:      General: He is not in acute distress.    Appearance: Normal appearance. He is not ill-appearing or toxic-appearing.  HENT:     Head: Normocephalic and atraumatic.  Neck:     Musculoskeletal: Normal range of motion and neck supple.     Comments: No midline tenderness.  Cardiovascular:     Rate and Rhythm: Normal rate and regular rhythm.     Pulses:  Radial pulses are 2+ on the right side and 2+ on the left side.  Pulmonary:     Effort: Pulmonary effort is normal. No respiratory distress.     Breath sounds: Normal breath sounds.  Abdominal:     General: There is no distension.     Palpations: Abdomen is soft.     Tenderness: There is no abdominal tenderness.  Musculoskeletal:     Comments: Upper extremities: Left hand: 2nd digit: avulsion injury to the distal phalanx including the nail/nail bed. 3rd digit: Injury to the nailbed with displacement of nail plate, concern for exposed bone. 4th digit: laceration to nailbed that runs diagonally to the palmar surface of the distal phalanx. Intact AROM throughout all IP/MCPs. Tender to palpation to the 2nd-5th distal phalanges & DIPs. Otherwise  nontender.   Skin:    General: Skin is warm and dry.     Capillary Refill: Capillary refill takes less than 2 seconds.  Neurological:     Mental Status: He is alert.     Comments: Alert. Clear speech. Sensation grossly intact to bilateral upper extremities. 5/5 symmetric grip strength. Ambulatory.   Psychiatric:        Mood and Affect: Mood normal.        Behavior: Behavior normal.            ED Treatments / Results  Labs (all labs ordered are listed, but only abnormal results are displayed) Labs Reviewed - No data to display  EKG None  Radiology Dg Hand Complete Left  Result Date: 04/13/2019 CLINICAL DATA:  Left hand injury, fingers crushed EXAM: LEFT HAND - COMPLETE 3+ VIEW COMPARISON:  None. FINDINGS: Which there is nondisplaced fracture seen through the midshaft of the second and fourth distal phalanges. There is also chip fracture seen of the distal tuft of the third digit. There is also comminuted slightly impacted intra-articular fracture seen through the fifth distal phalanx. Significant overlying soft tissue swelling is seen. There is a well corticated ossicle seen at the ulnar base of the fourth distal phalanx, likely prior injury. No radiopaque foreign body is seen. IMPRESSION: 1. Nondisplaced transversely oriented fractures of the second and fourth distal phalanges. 2. Tiny chip fractures from the distal tuft of the third digit. 3. Comminuted intra-articular fracture seen of the fifth distal phalanx at the DIP joint. 4. No radiopaque foreign body. Electronically Signed   By: Jonna Clark M.D.   On: 04/13/2019 13:19    Procedures Procedures (including critical care time)  Medications Ordered in ED Medications  oxyCODONE-acetaminophen (PERCOCET/ROXICET) 5-325 MG per tablet 1 tablet (1 tablet Oral Given 04/13/19 1224)     Initial Impression / Assessment and Plan / ED Course  I have reviewed the triage vital signs and the nursing notes.  Pertinent labs & imaging  results that were available during my care of the patient were reviewed by me and considered in my medical decision making (see chart for details).   Patient presents to the ED w/ multiple open fractures of distal phalanges of the L hand. Nontoxic appearing, vitals WNL. Tetanus up to date. Start Ancef, analgesics ordered as well. Patient NPO. Will discuss w/ hand surgery.   14:29: CONSULT: Discussed w/ Earney Hamburg PA-C w/ hand surgery- will come to the ED to see patient.  15:15: CONSULT: Seen by orthpedics team- plan for OR, covid testing & basic labs ordered per discussion.    Findings and plan of care discussed with supervising physician Dr. Pilar Plate who has evaluated  patient & is in agreement.   Patient care transitioned to [facility] PA-C at change of shift pending labs & patient being taken to OR by hand surgery team.   Ryler Laskowski was evaluated in Emergency Department on 04/13/2019 for the symptoms described in the history of present illness. He/she was evaluated in the context of the global COVID-19 pandemic, which necessitated consideration that the patient might be at risk for infection with the SARS-CoV-2 virus that causes COVID-19. Institutional protocols and algorithms that pertain to the evaluation of patients at risk for COVID-19 are in a state of rapid change based on information released by regulatory bodies including the CDC and federal and state organizations. These policies and algorithms were followed during the patient's care in the ED.   Final Clinical Impressions(s) / ED Diagnoses   Final diagnoses:  Multiple open fractures of finger, initial encounter    ED Discharge Orders    None       Amaryllis Dyke, PA-C 04/13/19 1617    Maudie Flakes, MD 04/16/19 1549

## 2019-04-13 NOTE — Anesthesia Postprocedure Evaluation (Signed)
Anesthesia Post Note  Patient: Garrett Holt  Procedure(s) Performed: IRRIGATION AND DEBRIDEMENT EXTREMITY (Left Hand)     Patient location during evaluation: PACU Anesthesia Type: General Level of consciousness: awake and alert Pain management: pain level controlled Vital Signs Assessment: post-procedure vital signs reviewed and stable Respiratory status: spontaneous breathing, nonlabored ventilation, respiratory function stable and patient connected to nasal cannula oxygen Cardiovascular status: blood pressure returned to baseline and stable Postop Assessment: no apparent nausea or vomiting Anesthetic complications: no    Last Vitals:  Vitals:   04/13/19 2015 04/13/19 2030  BP: (!) 161/94 (!) 159/89  Pulse: 98 91  Resp: (!) 27 17  Temp:  36.7 C  SpO2: 98% 99%    Last Pain:  Vitals:   04/13/19 2015  TempSrc:   PainSc: 0-No pain                 Ryan P Ellender

## 2019-04-13 NOTE — ED Triage Notes (Signed)
Pt with left hand injury at work, he caught it on a conveyor belt. Took tylenol PTA. Left index, middle, and ring finger with extensive avulsions and lacerations. Cleaned and bandaged at triage with pressure dressing bleed now in control. Denies blood thinner use.

## 2019-04-13 NOTE — Anesthesia Procedure Notes (Signed)
Procedure Name: Intubation Date/Time: 04/13/2019 7:09 PM Performed by: Shirlyn Goltz, CRNA Pre-anesthesia Checklist: Patient identified, Emergency Drugs available, Suction available and Patient being monitored Patient Re-evaluated:Patient Re-evaluated prior to induction Oxygen Delivery Method: Circle system utilized Preoxygenation: Pre-oxygenation with 100% oxygen Induction Type: IV induction Ventilation: Mask ventilation without difficulty Laryngoscope Size: Mac and 4 Grade View: Grade II Tube type: Oral Tube size: 7.5 mm Number of attempts: 1 Airway Equipment and Method: Stylet Placement Confirmation: ETT inserted through vocal cords under direct vision,  positive ETCO2 and breath sounds checked- equal and bilateral Secured at: 22 cm Tube secured with: Tape Dental Injury: Injury to lip

## 2019-04-13 NOTE — Progress Notes (Signed)
I have seen and examined this patient; agree with PA Jeffery's asessment and plan.  Will take pt to OR for revision amputation of LIF, repair LF, RF.  All pt's questions answered.

## 2019-04-13 NOTE — Discharge Instructions (Signed)
Discharge Instructions:  Keep your dressing clean, dry and in place until instructed to remove by Dr. Romey Mathieson.  If the dressing becomes dirty or wet call the office for instructions during business hours. Elevate the extremity to help with swelling, this will also help with any discomfort. Take your medication as prescribed. No lifting with the injured  extremity. If you feel that the dressing is too tight, you may loosen it, but keep it on; finger tips should be pink; if there is a concern, call the office. [phone] Ice may be used if the injury is a fracture, do not apply ice directly to the skin. Please call the office on the next business day after discharge to arrange a follow up appointment.  Call [phone] between the hours of 9am - 5pm M-Th or 9am - 1pm on Fri. For most hand injuries and/or conditions, you may return to work using the uninjured hand (one handed duty) within 24-72 hours.  A detailed note will be provided to you at your follow up appointment or may contact the office prior to your follow up.    

## 2019-04-13 NOTE — Consult Note (Signed)
Reason for Consult:Left hand injury Referring Physician: Zyen Holt is an 63 y.o. male.  HPI: Garrett Holt was at work and got his left hand caught in the chain drive of a conveyor belt. It grabbed his sleeve and pulled his hand into the drive. He had injuries to the middle 3 fingers and came to the ED for evaluation. He c/o pain in the affected digits. He is RHD.  Past Medical History:  Diagnosis Date  . ETOH abuse     History reviewed. No pertinent surgical history.  History reviewed. No pertinent family history.  Social History:  reports that he has quit smoking. He has never used smokeless tobacco. He reports current alcohol use. He reports that he does not use drugs.  Allergies: No Known Allergies  Medications: I have reviewed the patient's current medications.  No results found for this or any previous visit (from the past 48 hour(s)).  Dg Hand Complete Left  Result Date: 04/13/2019 CLINICAL DATA:  Left hand injury, fingers crushed EXAM: LEFT HAND - COMPLETE 3+ VIEW COMPARISON:  None. FINDINGS: Which there is nondisplaced fracture seen through the midshaft of the second and fourth distal phalanges. There is also chip fracture seen of the distal tuft of the third digit. There is also comminuted slightly impacted intra-articular fracture seen through the fifth distal phalanx. Significant overlying soft tissue swelling is seen. There is a well corticated ossicle seen at the ulnar base of the fourth distal phalanx, likely prior injury. No radiopaque foreign body is seen. IMPRESSION: 1. Nondisplaced transversely oriented fractures of the second and fourth distal phalanges. 2. Tiny chip fractures from the distal tuft of the third digit. 3. Comminuted intra-articular fracture seen of the fifth distal phalanx at the DIP joint. 4. No radiopaque foreign body. Electronically Signed   By: Jonna Clark M.D.   On: 04/13/2019 13:19    Review of Systems  Constitutional: Negative for weight loss.   HENT: Negative for ear discharge, ear pain, hearing loss and tinnitus.   Eyes: Negative for blurred vision, double vision, photophobia and pain.  Respiratory: Negative for cough, sputum production and shortness of breath.   Cardiovascular: Negative for chest pain.  Gastrointestinal: Negative for abdominal pain, nausea and vomiting.  Genitourinary: Negative for dysuria, flank pain, frequency and urgency.  Musculoskeletal: Positive for joint pain (Left hand). Negative for back pain, falls, myalgias and neck pain.  Neurological: Negative for dizziness, tingling, sensory change, focal weakness, loss of consciousness and headaches.  Endo/Heme/Allergies: Does not bruise/bleed easily.  Psychiatric/Behavioral: Negative for depression, memory loss and substance abuse. The patient is not nervous/anxious.    Blood pressure 110/66, pulse 73, temperature 98.5 F (36.9 C), temperature source Oral, resp. rate 16, SpO2 96 %. Physical Exam  Constitutional: He appears well-developed and well-nourished. No distress.  HENT:  Head: Normocephalic and atraumatic.  Eyes: Conjunctivae are normal. Right eye exhibits no discharge. Left eye exhibits no discharge. No scleral icterus.  Neck: Normal range of motion.  Cardiovascular: Normal rate and regular rhythm.  Respiratory: Effort normal. No respiratory distress.  Musculoskeletal:     Comments: Left shoulder, elbow, wrist, digits- Partial amputation P3 index finger, distal phalanx protruding from tip, lacerations and crush injuries to P3 long and ring fingers, mod TTP, no TTP base of little finger, no instability, no blocks to motion  Sens  Ax/R/M/U intact  Mot   Ax/ R/ PIN/ M/ AIN/ U intact  Rad 2+  Neurological: He is alert.  Skin: Skin is  warm and dry. He is not diaphoretic.  Psychiatric: He has a normal mood and affect. His behavior is normal.        Assessment/Plan: Left hand partial amputation index finger with less severe injuries to long and ring  fingers -- Pt will need to go to OR for revision amputation of the index finger and likely nail removal and nailbed repair for the other two. Dr. Lenon Curt to perform this afternoon. Anticipate discharge after surgery.    Lisette Abu, PA-C Orthopedic Surgery [phone] 04/13/2019, 3:17 PM

## 2019-04-13 NOTE — Transfer of Care (Signed)
Immediate Anesthesia Transfer of Care Note  Patient: Garrett Holt  Procedure(s) Performed: IRRIGATION AND DEBRIDEMENT EXTREMITY (Left Hand)  Patient Location: PACU  Anesthesia Type:General  Level of Consciousness: awake, alert , oriented and patient cooperative  Airway & Oxygen Therapy: Patient Spontanous Breathing and Patient connected to nasal cannula oxygen  Post-op Assessment: Report given to RN and Post -op Vital signs reviewed and stable  Post vital signs: Reviewed and stable  Last Vitals:  Vitals Value Taken Time  BP 163/92 04/13/19 1957  Temp 36.5 C 04/13/19 1955  Pulse 91 04/13/19 1957  Resp 13 04/13/19 1957  SpO2 100 % 04/13/19 1957  Vitals shown include unvalidated device data.  Last Pain:  Vitals:   04/13/19 1622  TempSrc:   PainSc: 2          Complications: No apparent anesthesia complications

## 2019-04-13 NOTE — Anesthesia Preprocedure Evaluation (Addendum)
Anesthesia Evaluation  Patient identified by MRN, date of birth, ID band Patient awake    Reviewed: Allergy & Precautions, NPO status , Patient's Chart, lab work & pertinent test results  Airway Mallampati: II  TM Distance: >3 FB Neck ROM: Full    Dental  (+) Dental Advisory Given, Teeth Intact, Chipped, Missing,    Pulmonary former smoker,    Pulmonary exam normal breath sounds clear to auscultation       Cardiovascular negative cardio ROS Normal cardiovascular exam Rhythm:Regular Rate:Normal     Neuro/Psych PSYCHIATRIC DISORDERS negative neurological ROS     GI/Hepatic negative GI ROS, (+)     substance abuse  alcohol use,   Endo/Other  negative endocrine ROS  Renal/GU negative Renal ROS     Musculoskeletal negative musculoskeletal ROS (+)   Abdominal   Peds  Hematology  (+) anemia ,   Anesthesia Other Findings Partial amputation left index finger  Reproductive/Obstetrics                           Anesthesia Physical Anesthesia Plan  ASA: II and emergent  Anesthesia Plan: General   Post-op Pain Management:    Induction: Intravenous and Rapid sequence  PONV Risk Score and Plan: 3 and Ondansetron, Dexamethasone, Treatment may vary due to age or medical condition and Midazolam  Airway Management Planned: Oral ETT  Additional Equipment: None  Intra-op Plan:   Post-operative Plan: Extubation in OR  Informed Consent: I have reviewed the patients History and Physical, chart, labs and discussed the procedure including the risks, benefits and alternatives for the proposed anesthesia with the patient or authorized representative who has indicated his/her understanding and acceptance.     Dental advisory given  Plan Discussed with: CRNA  Anesthesia Plan Comments:       Anesthesia Quick Evaluation

## 2019-04-13 NOTE — ED Notes (Signed)
Informed consent signed at [phone]

## 2019-04-14 ENCOUNTER — Encounter (HOSPITAL_COMMUNITY): Payer: Self-pay | Admitting: General Surgery

## 2019-04-26 NOTE — Op Note (Signed)
NAMEDENSON, NICCOLI MEDICAL RECORD [id number] ACCOUNT [id number] DATE OF BIRTH:1956-02-25 FACILITY: [facility] LOCATION: [facility] PHYSICIAN:Tricha Ruggirello C. 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  Skin laceration,  left long finger.  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  Skin laceration,  left long finger.  PROCEDURE: 1.  Revision amputation of the left index fingertip with application of Integra bilayer. 2.  Removal of nail plate of the left long finger; repair of nail bed, left long finger; repair of laceration measuring 2 cm on the left long finger; repair of nail bed, left ring finger; closed treatment of distal phalanx fractures of the left long,  left ring, and left small fingers.  INDICATIONS:  The patient is a 63 year old gentleman who got his hand and fingers caught in some kind of rowing machine at work, sustaining the above-mentioned injuries.  He presented to the emergency department.  I was consulted.  Risks, benefits, and  alternatives of surgery were discussed with him including revision amputation, fracture treatment, laceration treatment, infection, loss of function.  Consent was obtained.  DESCRIPTION OF PROCEDURE:  The patient was taken to the operating room and placed supine on the operating room table.  Timeout was performed.  Preoperative antibiotics were given.  Anesthesia was administered without difficulty.  The left upper extremity  was prepped and draped in normal sterile fashion.  Tourniquet was used on the upper arm.  The arm was  exsanguinated, and the tourniquet was inflated to 250 mmHg.  The x-ray was brought into view.  The fracture of the distal phalanx of the left small  finger was essentially nondisplaced, so no additional treatment was needed.  Fractures of the left ring and left long fingers were imaged.  Again, these fractures were nondisplaced and no additional fracture treatment was needed.  Next, the nail plate of  the left ring and left long fingers was removed exposing the underlying nail bed laceration of the left ring finger.  It was repaired with interrupted 6-0 chromic sutures.  The nail bed laceration of the left long finger was repaired with interrupted  6-0 chromic sutures.  The associated laceration of the left long finger was repaired with 5-0 nylon totaling about 2 cm.  Following, the index finger was addressed.  Most of the nail and fingertip were avulsed.  There was distal phalanx bone that was  protruding.  This bone was rongeured back underneath the soft tissue.  The nonviable soft tissues, skin, and subcutaneous tissue were sharply debrided back to obtain a good bed.  Tourniquet was released.  Hemostasis was obtained with the index finger  with bipolar cautery.  A small piece of Integra bilayer was measured and placed on the tip of the index finger and sewn circumferentially around the wound for a nice closure.  Sterile dressings were placed over the index, long, and ring fingers.  The  patient awakened and was taken to the recovery room in stable condition.  LN/NUANCE  D:04/26/2019 T:04/26/2019 JOB:[id number]

## 2020-09-27 IMAGING — CR DG HAND COMPLETE 3+V*L*
4 series · 4 of 4 positions shown · non-contrast
Comparison: None.

CLINICAL DATA: Left hand injury, fingers crushed

EXAM:
LEFT HAND - COMPLETE 3+ VIEW

[hand pa]
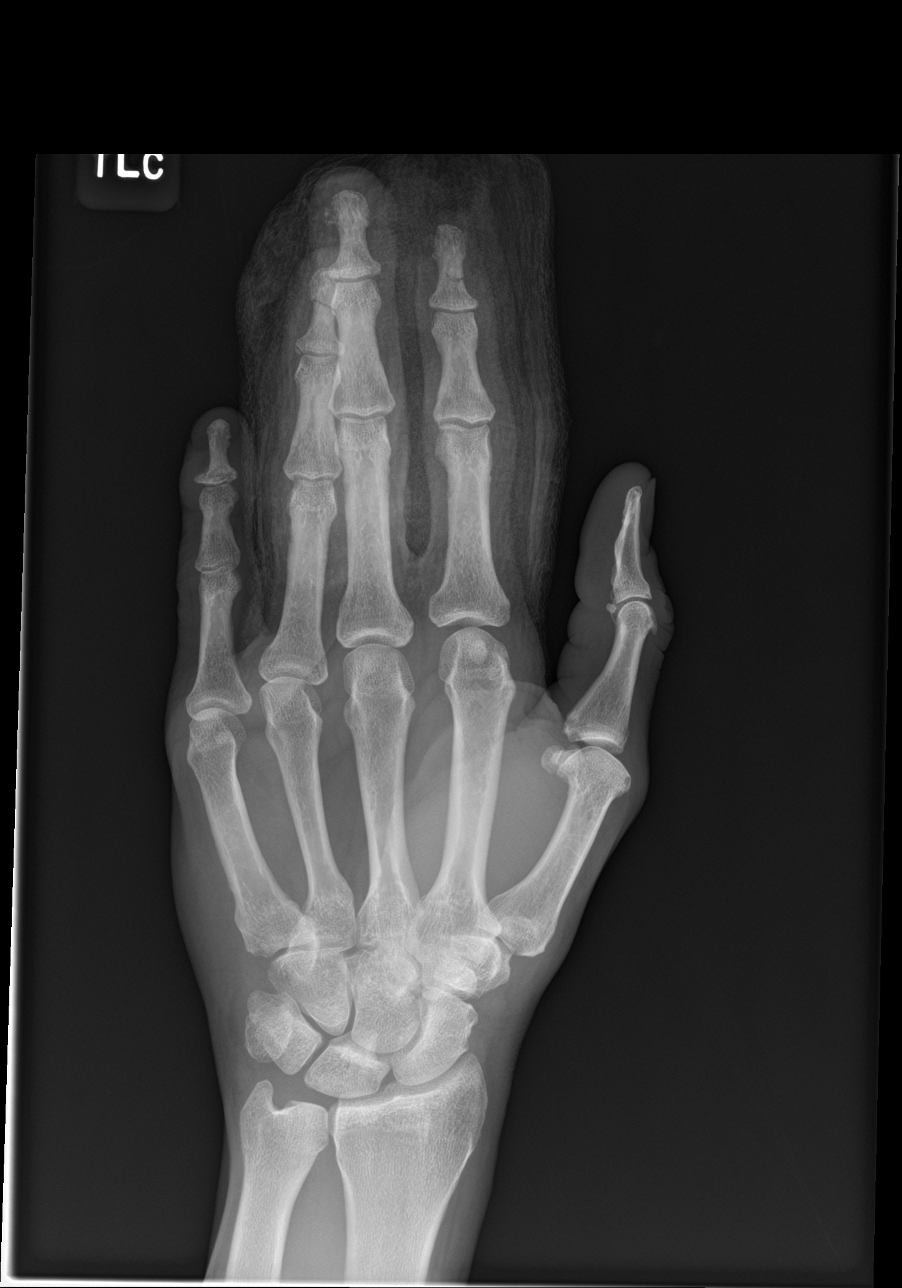

[hand obl (1 of 2)]
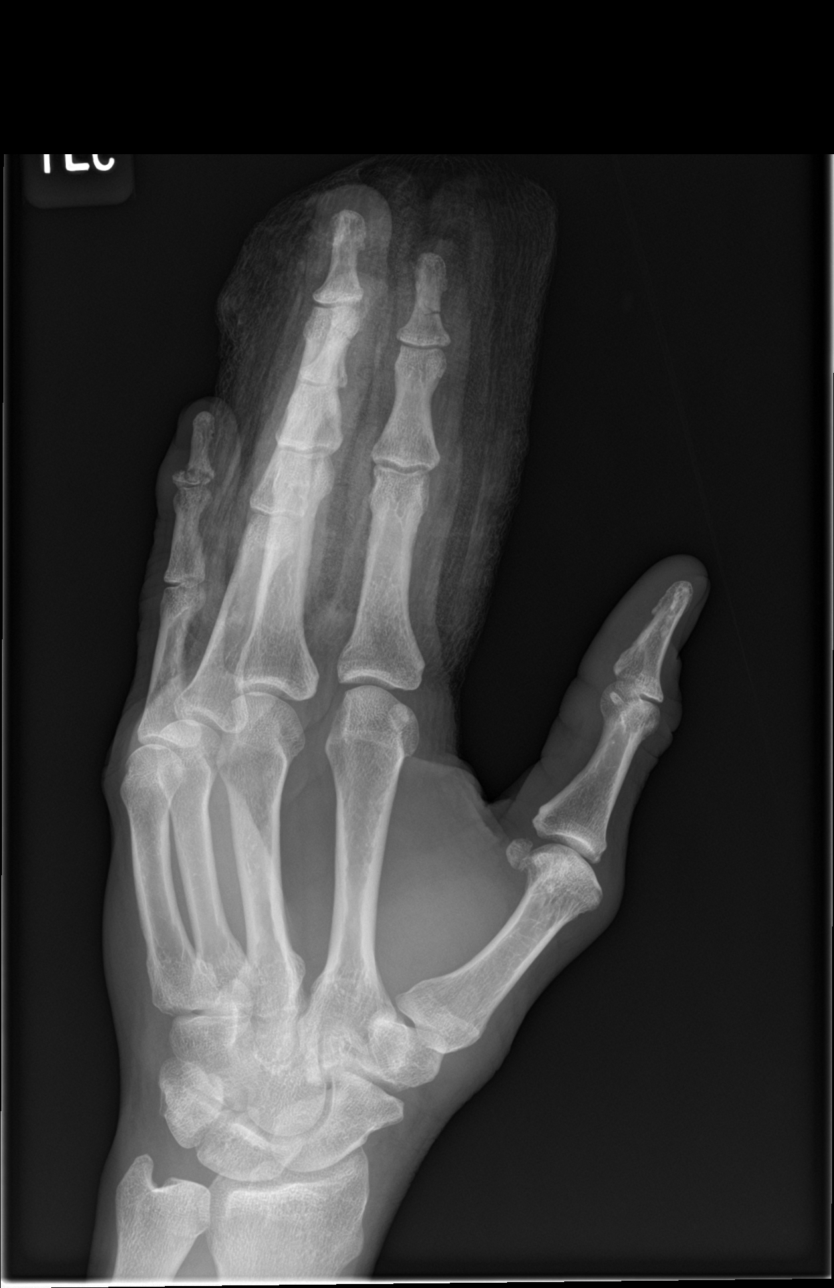

[hand lat]
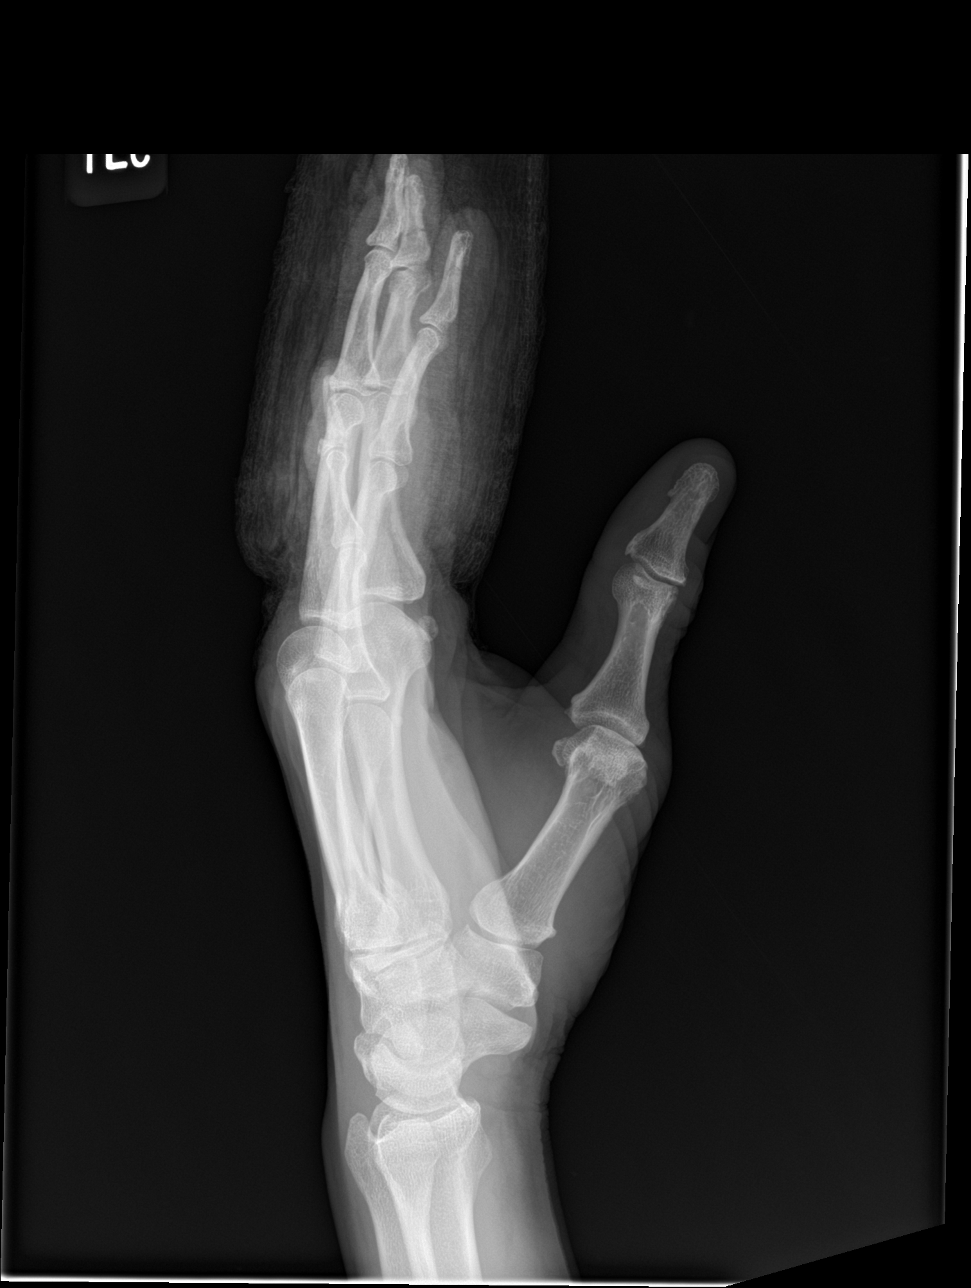

[hand obl (2 of 2)]
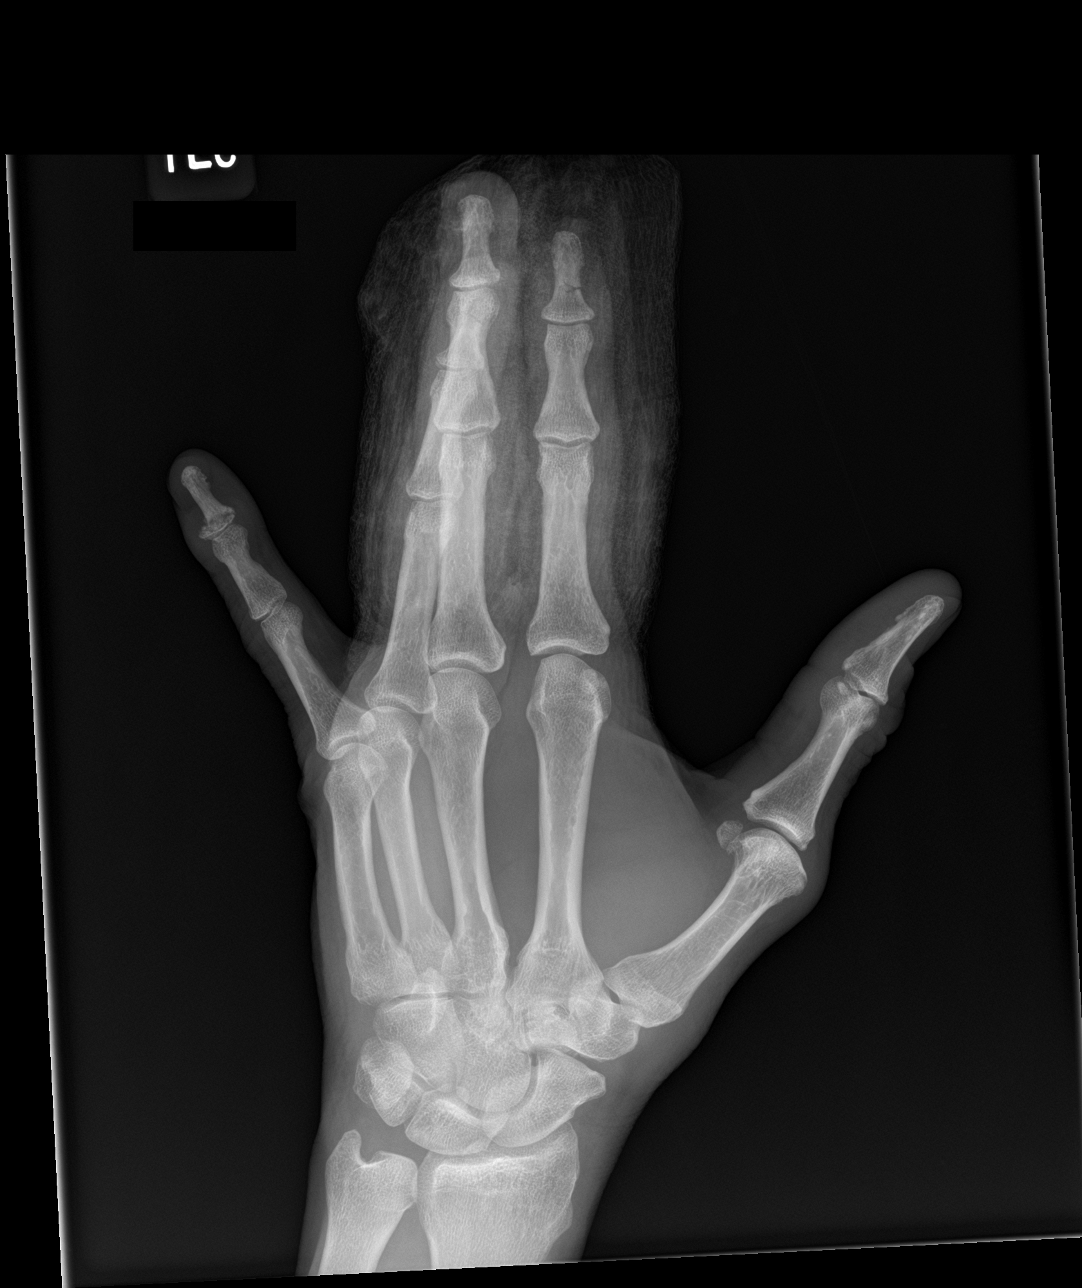

[4 of 4 positions shown; findings below may reference images not displayed]

FINDINGS: Which there is nondisplaced fracture seen through the midshaft of
the second and fourth distal phalanges. There is also chip fracture
seen of the distal tuft of the third digit. There is also comminuted
slightly impacted intra-articular fracture seen through the fifth
distal phalanx. Significant overlying soft tissue swelling is seen.
There is a well corticated ossicle seen at the ulnar base of the
fourth distal phalanx, likely prior injury. No radiopaque foreign
body is seen.
IMPRESSION: 1. Nondisplaced transversely oriented fractures of the second and
fourth distal phalanges.
2. Tiny chip fractures from the distal tuft of the third digit.
3. Comminuted intra-articular fracture seen of the fifth distal
phalanx at the DIP joint.
4. No radiopaque foreign body.

## 2021-06-02 ENCOUNTER — Inpatient Hospital Stay (HOSPITAL_BASED_OUTPATIENT_CLINIC_OR_DEPARTMENT_OTHER)
Admission: EM | Admit: 2021-06-02 | Discharge: 2021-06-05 | DRG: 177 | Disposition: A | Discharge status: Home / Self Care | Payer: Non-veteran care | Attending: Family Medicine | Admitting: Family Medicine

## 2021-06-02 ENCOUNTER — Emergency Department (HOSPITAL_BASED_OUTPATIENT_CLINIC_OR_DEPARTMENT_OTHER): Payer: Non-veteran care

## 2021-06-02 ENCOUNTER — Other Ambulatory Visit: Payer: Self-pay

## 2021-06-02 ENCOUNTER — Encounter (HOSPITAL_BASED_OUTPATIENT_CLINIC_OR_DEPARTMENT_OTHER): Payer: Self-pay | Admitting: *Deleted

## 2021-06-02 DIAGNOSIS — J90 Pleural effusion, not elsewhere classified: Secondary | ICD-10-CM | POA: Diagnosis not present

## 2021-06-02 DIAGNOSIS — J918 Pleural effusion in other conditions classified elsewhere: Secondary | ICD-10-CM | POA: Diagnosis present

## 2021-06-02 DIAGNOSIS — J1282 Pneumonia due to coronavirus disease 2019: Secondary | ICD-10-CM | POA: Diagnosis present

## 2021-06-02 DIAGNOSIS — F1010 Alcohol abuse, uncomplicated: Secondary | ICD-10-CM | POA: Diagnosis present

## 2021-06-02 DIAGNOSIS — I2699 Other pulmonary embolism without acute cor pulmonale: Secondary | ICD-10-CM | POA: Diagnosis present

## 2021-06-02 DIAGNOSIS — J9601 Acute respiratory failure with hypoxia: Secondary | ICD-10-CM | POA: Diagnosis present

## 2021-06-02 DIAGNOSIS — U071 COVID-19: Principal | ICD-10-CM | POA: Diagnosis present

## 2021-06-02 LAB — CBC
HCT: 42.5 % (ref 39.0–52.0)
Hemoglobin: 13.8 g/dL (ref 13.0–17.0)
MCH: 28.2 pg (ref 26.0–34.0)
MCHC: 32.5 g/dL (ref 30.0–36.0)
MCV: 86.9 fL (ref 80.0–100.0)
Platelets: 261 10*3/uL (ref 150–400)
RBC: 4.89 MIL/uL (ref 4.22–5.81)
RDW: 13.7 % (ref 11.5–15.5)
WBC: 10 10*3/uL (ref 4.0–10.5)
nRBC: 0 % (ref 0.0–0.2)

## 2021-06-02 LAB — BASIC METABOLIC PANEL
Anion gap: 10 (ref 5–15)
BUN: 13 mg/dL (ref 8–23)
CO2: 24 mmol/L (ref 22–32)
Calcium: 9.1 mg/dL (ref 8.9–10.3)
Chloride: 101 mmol/L (ref 98–111)
Creatinine, Ser: 0.91 mg/dL (ref 0.61–1.24)
GFR, Estimated: >60 mL/min (ref >60)
Glucose, Bld: 104 mg/dL — ABNORMAL HIGH (ref 70–99)
Potassium: 3.9 mmol/L (ref 3.5–5.1)
Sodium: 135 mmol/L (ref 135–145)

## 2021-06-02 LAB — RESP PANEL BY RT-PCR (FLU A&B, COVID) ARPGX2
Influenza A by PCR: NEGATIVE
Influenza B by PCR: NEGATIVE
SARS Coronavirus 2 by RT PCR: POSITIVE — AB

## 2021-06-02 LAB — TROPONIN I (HIGH SENSITIVITY): Troponin I (High Sensitivity): 3 ng/L (ref <18)

## 2021-06-02 LAB — BRAIN NATRIURETIC PEPTIDE: B Natriuretic Peptide: 22.7 pg/mL (ref 0.0–100.0)

## 2021-06-02 MED ORDER — IOHEXOL 300 MG/ML  SOLN
100.0000 mL | Freq: Once | INTRAMUSCULAR | Status: AC | PRN
  Administered 2021-06-02: 80 mL via INTRAVENOUS

## 2021-06-02 MED ORDER — HEPARIN (PORCINE) 25000 UT/250ML-% IV SOLN
1550.0000 [IU]/h | INTRAVENOUS | Status: DC
  Administered 2021-06-02: 1250 [IU]/h via INTRAVENOUS
  Administered 2021-06-03 – 2021-06-04 (×2): 1550 [IU]/h via INTRAVENOUS
  Filled 2021-06-02 (×3): qty 250

## 2021-06-02 MED ORDER — HEPARIN BOLUS VIA INFUSION
5500.0000 [IU] | Freq: Once | INTRAVENOUS | Status: AC
  Administered 2021-06-02: 5500 [IU] via INTRAVENOUS

## 2021-06-02 NOTE — Progress Notes (Signed)
ANTICOAGULATION CONSULT NOTE - Initial Consult  Pharmacy Consult for heparin Indication: pulmonary embolus  No Known Allergies  Patient Measurements: Height: 5' 9.75" (177.2 cm) Weight: 78 kg (172 lb) IBW/kg (Calculated) : 72.43 Heparin Dosing Weight: 78 kg  Vital Signs: Temp: 99 F (37.2 C) (11/12 1639) Temp Source: Oral (11/12 1639) BP: 167/91 (11/12 2000) Pulse Rate: 87 (11/12 2000)  Labs: Recent Labs    06/02/21 1834  HGB 13.8  HCT 42.5  PLT 261  CREATININE 0.91  TROPONINIHS 3    Estimated Creatinine Clearance: 84 mL/min (by C-G formula based on SCr of 0.91 mg/dL).   Medical History: Past Medical History:  Diagnosis Date   ETOH abuse     Medications: see MAR  Assessment: 65 yo M with acute PE, no RHS per CT read. No AC PTA. CBC ok.   Goal of Therapy:  Heparin level 0.3-0.7 units/ml Monitor platelets by anticoagulation protocol: Yes   Plan:  Give 5500 units bolus x 1 Start heparin infusion at 1250 units/hr Check anti-Xa level in 6 hours and daily while on heparin Continue to monitor H&H and platelets F/u long term [facility] plan  Loleta Dicker, PharmD, [facility] Emergency Medicine Clinical Pharmacist ED RPh Phone: [phone] Main RX: [phone]

## 2021-06-02 NOTE — Progress Notes (Signed)
[facility] will assume care on arrival to accepting facility. Until arrival, care as per EDP. However, [facility] available 24/7 for questions and assistance.   Nursing staff please page [facility]H Admits and Consults ([phone]) as soon as the patient arrives to the hospital.  Spring San, DO  

## 2021-06-02 NOTE — ED Provider Notes (Signed)
9:08 PM signout taken from [place] PA-C at shift change.  Discussed with Dr. Imogene Burn with [facility].  Patient will be admitted for hypoxic respiratory failure with PE in setting of positive COVID testing.  Patient seen, he appears comfortable.  Questions answered.  BP (!) 159/94   Pulse 87   Temp 99 F (37.2 C) (Oral)   Resp (!) 21   Ht 5' 9.75" (1.772 m)   Wt 78 kg   SpO2 97%   BMI 24.86 kg/m     Garrett Holt, Cordelia Poche 06/02/21 2109    Ernie Avena, MD 06/02/21 2229

## 2021-06-02 NOTE — ED Triage Notes (Addendum)
Pt reports pain (points to right ribs) with breathing x 2 days. Denies injury, denies fever. States he has had "a little bit of cough". Pain is worse with coughing

## 2021-06-02 NOTE — ED Provider Notes (Addendum)
[facility] EMERGENCY DEPARTMENT Provider Note   CSN: [id number] Arrival date & time: 06/02/21  1624     History Chief Complaint  Patient presents with   Pain    Right ribs    Garrett Holt is a 65 y.o. male.  65 year old male presents today with his cousin for evaluation of right-sided chest pain onset Thursday.  Patient reports Thursday he developed a cough as well as right-sided chest pain with inspiration or coughing.  The chest pain does not radiate anywhere.  He is without nausea, vomiting, lightheadedness, or palpitations.  Pain is not reproducible with palpation.  He has not taken anything for this over-the-counter.  Reports his cough is nonproductive. Denies shortness of breath with exertion.  He denies recent sick contacts.  He denies any fever, chills, abdominal pain, changes in his bowel habits.  The history is provided by the patient. No language interpreter was used.      Past Medical History:  Diagnosis Date   ETOH abuse     Patient Active Problem List   Diagnosis Date Noted   Alcohol dependence with uncomplicated withdrawal (HCC) 07/15/2015   Alcohol-induced mood disorder (HCC) 07/15/2015    Past Surgical History:  Procedure Laterality Date   I & D EXTREMITY Left 04/13/2019   Procedure: IRRIGATION AND DEBRIDEMENT EXTREMITY;  Surgeon: Knute Neu, MD;  Location: [facility] OR;  Service: Plastics;  Laterality: Left;       No family history on file.  Social History   Tobacco Use   Smoking status: Former   Smokeless tobacco: Never  Vaping Use   Vaping Use: Never used  Substance Use Topics   Alcohol use: Not Currently    Comment: none since March 2022 per pt   Drug use: No    Home Medications Prior to Admission medications   Medication Sig Start Date End Date Taking? Authorizing Provider  acetaminophen (TYLENOL) 325 MG tablet Take 650 mg by mouth every 6 (six) hours as needed for mild pain.    [provider]  cephALEXin (KEFLEX) 500  MG capsule Take 1 capsule (500 mg total) by mouth 4 (four) times daily. 04/13/19   Knute Neu, MD    Allergies    Patient has no known allergies.  Review of Systems   Review of Systems  Constitutional:  Negative for activity change, chills and fever.  Respiratory:  Positive for cough. Negative for shortness of breath.   Cardiovascular:  Positive for chest pain. Negative for palpitations.  Gastrointestinal:  Negative for abdominal pain, nausea and vomiting.  Neurological:  Negative for light-headedness and headaches.  All other systems reviewed and are negative.  Physical Exam Updated Vital Signs BP (!) 157/87 (BP Location: Right Arm)   Pulse 78   Temp 99 F (37.2 C) (Oral)   Resp (!) 24   Ht 5' 9.75" (1.772 m)   Wt 78 kg   SpO2 95%   BMI 24.86 kg/m   Physical Exam Vitals and nursing note reviewed.  Constitutional:      General: He is not in acute distress.    Appearance: Normal appearance. He is not ill-appearing.  HENT:     Head: Normocephalic and atraumatic.     Nose: Nose normal.  Eyes:     General: No scleral icterus.    Extraocular Movements: Extraocular movements intact.     Conjunctiva/sclera: Conjunctivae normal.  Cardiovascular:     Rate and Rhythm: Normal rate and regular rhythm.     Pulses: Normal  pulses.     Heart sounds: Normal heart sounds.  Pulmonary:     Effort: Pulmonary effort is normal. No respiratory distress.     Breath sounds: Normal breath sounds. No wheezing or rales.  Abdominal:     General: There is no distension.     Tenderness: There is no abdominal tenderness.  Musculoskeletal:        General: Normal range of motion.     Cervical back: Normal range of motion.     Right lower leg: No edema.     Left lower leg: No edema.  Skin:    General: Skin is warm and dry.  Neurological:     General: No focal deficit present.     Mental Status: He is alert. Mental status is at baseline.    ED Results / Procedures / Treatments    Labs (all labs ordered are listed, but only abnormal results are displayed) Labs Reviewed  RESP PANEL BY RT-PCR (FLU A&B, COVID) [id number]  CBC  BASIC METABOLIC PANEL  TROPONIN I (HIGH SENSITIVITY)    EKG None  Radiology No results found.  Procedures .Critical Care E&M Performed by: Marita Kansas, PA-C  Critical care provider statement:    Critical care time (minutes):  45   Critical care was necessary to treat or prevent imminent or life-threatening deterioration of the following conditions:  Respiratory failure   Critical care was time spent personally by me on the following activities:  Ordering and review of laboratory studies, ordering and review of radiographic studies, pulse oximetry, evaluation of patient's response to treatment, examination of patient, obtaining history from patient or surrogate, review of old charts and re-evaluation of patient's condition After initial E/M assessment, critical care services were subsequently performed that were exclusive of separately billable procedures or treatment.     Medications Ordered in ED Medications - No data to display  ED Course  I have reviewed the triage vital signs and the nursing notes.  Pertinent labs & imaging results that were available during my care of the patient were reviewed by me and considered in my medical decision making (see chart for details).  Clinical Course as of 06/02/21 2002  Sat Jun 02, 2021  2536 Patient desatted and required 2 L O2.  Patient evaluated at bedside and found to be tachypneic.  Patient was ambulated on room air within the room by this provider noted to have desaturation to 85% and increased work of breathing.  Patient was placed on 2 L nasal cannula patient recovery and sats and improvement in his work of breathing.  Patient's chest x-ray significant for bilateral pleural effusions.  Will obtain CT chest with contrast to further evaluate.  Given tachypnea and hypoxia suspect this patient  will likely need to be admitted. [AA]    Clinical Course User Index [AA] Marita Kansas, PA-C   MDM Rules/Calculators/A&P                           65 year old male presents today for evaluation of right-sided chest pain and cough with onset of Thursday.  He denies any fever, dyspnea on exertion and reports his cough is nonproductive.   Patient while awaiting work-up desatted and required 2 L nasal cannula.  CT scan was done to evaluate pleural effusions and found to have PE with potential right lower lobe infarct without right heart strain.  Heparin was protocol ordered for PE.  Patient also came back COVID-positive. Patient  at the end of the shift signed out to Rhea Bleacher PA-C to consult hospitalist for admission. Patient and cousin updated on results and plan.  Final Clinical Impression(s) / ED Diagnoses Final diagnoses:  COVID  Pleural effusion  Acute pulmonary embolism, unspecified pulmonary embolism type, unspecified whether acute cor pulmonale present [facility])    Rx / DC Orders ED Discharge Orders     None        Marita Kansas, PA-C 06/02/21 2026    Marita Kansas, PA-C 06/02/21 2030    Ernie Avena, MD 06/02/21 2235

## 2021-06-03 ENCOUNTER — Encounter (HOSPITAL_COMMUNITY): Payer: Self-pay | Admitting: Internal Medicine

## 2021-06-03 DIAGNOSIS — J9601 Acute respiratory failure with hypoxia: Secondary | ICD-10-CM

## 2021-06-03 DIAGNOSIS — U071 COVID-19: Principal | ICD-10-CM

## 2021-06-03 DIAGNOSIS — F1010 Alcohol abuse, uncomplicated: Secondary | ICD-10-CM

## 2021-06-03 DIAGNOSIS — J1282 Pneumonia due to coronavirus disease 2019: Secondary | ICD-10-CM | POA: Diagnosis present

## 2021-06-03 DIAGNOSIS — I2699 Other pulmonary embolism without acute cor pulmonale: Secondary | ICD-10-CM

## 2021-06-03 DIAGNOSIS — J918 Pleural effusion in other conditions classified elsewhere: Secondary | ICD-10-CM | POA: Diagnosis present

## 2021-06-03 DIAGNOSIS — J90 Pleural effusion, not elsewhere classified: Secondary | ICD-10-CM | POA: Diagnosis present

## 2021-06-03 LAB — COMPREHENSIVE METABOLIC PANEL
ALT: 29 U/L (ref 0–44)
AST: 22 U/L (ref 15–41)
Albumin: 3.3 g/dL — ABNORMAL LOW (ref 3.5–5.0)
Alkaline Phosphatase: 99 U/L (ref 38–126)
Anion gap: 9 (ref 5–15)
BUN: 10 mg/dL (ref 8–23)
CO2: 24 mmol/L (ref 22–32)
Calcium: 8.7 mg/dL — ABNORMAL LOW (ref 8.9–10.3)
Chloride: 100 mmol/L (ref 98–111)
Creatinine, Ser: 0.9 mg/dL (ref 0.61–1.24)
GFR, Estimated: >60 mL/min (ref >60)
Glucose, Bld: 156 mg/dL — ABNORMAL HIGH (ref 70–99)
Potassium: 4.1 mmol/L (ref 3.5–5.1)
Sodium: 133 mmol/L — ABNORMAL LOW (ref 135–145)
Total Bilirubin: 0.3 mg/dL (ref 0.3–1.2)
Total Protein: 7.3 g/dL (ref 6.5–8.1)

## 2021-06-03 LAB — CBC
HCT: 39.7 % (ref 39.0–52.0)
Hemoglobin: 12.9 g/dL — ABNORMAL LOW (ref 13.0–17.0)
MCH: 28.1 pg (ref 26.0–34.0)
MCHC: 32.5 g/dL (ref 30.0–36.0)
MCV: 86.5 fL (ref 80.0–100.0)
Platelets: 250 10*3/uL (ref 150–400)
RBC: 4.59 MIL/uL (ref 4.22–5.81)
RDW: 13.5 % (ref 11.5–15.5)
WBC: 9.3 10*3/uL (ref 4.0–10.5)
nRBC: 0 % (ref 0.0–0.2)

## 2021-06-03 LAB — CBC WITH DIFFERENTIAL/PLATELET
Abs Immature Granulocytes: 0.05 10*3/uL (ref 0.00–0.07)
Basophils Absolute: 0 10*3/uL (ref 0.0–0.1)
Basophils Relative: 0 %
Eosinophils Absolute: 0 10*3/uL (ref 0.0–0.5)
Eosinophils Relative: 0 %
HCT: 39.8 % (ref 39.0–52.0)
Hemoglobin: 13.2 g/dL (ref 13.0–17.0)
Immature Granulocytes: 1 %
Lymphocytes Relative: 7 %
Lymphs Abs: 0.7 10*3/uL (ref 0.7–4.0)
MCH: 28.1 pg (ref 26.0–34.0)
MCHC: 33.2 g/dL (ref 30.0–36.0)
MCV: 84.9 fL (ref 80.0–100.0)
Monocytes Absolute: 0.3 10*3/uL (ref 0.1–1.0)
Monocytes Relative: 3 %
Neutro Abs: 8.9 10*3/uL — ABNORMAL HIGH (ref 1.7–7.7)
Neutrophils Relative %: 89 %
Platelets: 254 10*3/uL (ref 150–400)
RBC: 4.69 MIL/uL (ref 4.22–5.81)
RDW: 13.5 % (ref 11.5–15.5)
WBC: 10 10*3/uL (ref 4.0–10.5)
nRBC: 0 % (ref 0.0–0.2)

## 2021-06-03 LAB — PROCALCITONIN: Procalcitonin: <0.1 ng/mL

## 2021-06-03 LAB — D-DIMER, QUANTITATIVE: D-Dimer, Quant: 2.12 ug/mL-FEU — ABNORMAL HIGH (ref 0.00–0.50)

## 2021-06-03 LAB — HEPARIN LEVEL (UNFRACTIONATED)
Heparin Unfractionated: 0.31 IU/mL (ref 0.30–0.70)
Heparin Unfractionated: 0.27 IU/mL — ABNORMAL LOW (ref 0.30–0.70)

## 2021-06-03 LAB — HIV ANTIBODY (ROUTINE TESTING W REFLEX): HIV Screen 4th Generation wRfx: NONREACTIVE

## 2021-06-03 LAB — C-REACTIVE PROTEIN: CRP: 16.6 mg/dL — ABNORMAL HIGH (ref <1.0)

## 2021-06-03 MED ORDER — LORAZEPAM 2 MG/ML IJ SOLN: 1.0000 mg | INTRAMUSCULAR | Status: DC | PRN

## 2021-06-03 MED ORDER — ADULT MULTIVITAMIN W/MINERALS CH: 1.0000 | ORAL_TABLET | Freq: Every day | ORAL | Status: DC

## 2021-06-03 MED ORDER — METHYLPREDNISOLONE SODIUM SUCC 125 MG IJ SOLR
60.0000 mg | Freq: Two times a day (BID) | INTRAMUSCULAR | Status: AC
  Administered 2021-06-03 (×2): 60 mg via INTRAVENOUS
  Filled 2021-06-03 (×2): qty 2

## 2021-06-03 MED ORDER — METHYLPREDNISOLONE SODIUM SUCC 40 MG IJ SOLR
0.5000 mg/kg | Freq: Two times a day (BID) | INTRAMUSCULAR | Status: DC
  Administered 2021-06-03: 38.4 mg via INTRAVENOUS
  Filled 2021-06-03: qty 1

## 2021-06-03 MED ORDER — SODIUM CHLORIDE 0.9 % IV SOLN
200.0000 mg | Freq: Once | INTRAVENOUS | Status: AC
  Administered 2021-06-03: 200 mg via INTRAVENOUS
  Filled 2021-06-03: qty 40

## 2021-06-03 MED ORDER — THIAMINE HCL 100 MG/ML IJ SOLN: 100.0000 mg | Freq: Every day | INTRAMUSCULAR | Status: DC

## 2021-06-03 MED ORDER — LORAZEPAM 1 MG PO TABS: 1.0000 mg | ORAL_TABLET | ORAL | Status: DC | PRN

## 2021-06-03 MED ORDER — THIAMINE HCL 100 MG PO TABS: 100.0000 mg | ORAL_TABLET | Freq: Every day | ORAL | Status: DC

## 2021-06-03 MED ORDER — HYDROCOD POLST-CPM POLST ER 10-8 MG/5ML PO SUER
5.0000 mL | Freq: Two times a day (BID) | ORAL | Status: DC | PRN
  Administered 2021-06-03: 5 mL via ORAL
  Filled 2021-06-03: qty 5

## 2021-06-03 MED ORDER — ONDANSETRON HCL 4 MG PO TABS: 4.0000 mg | ORAL_TABLET | Freq: Four times a day (QID) | ORAL | Status: DC | PRN

## 2021-06-03 MED ORDER — GUAIFENESIN-DM 100-10 MG/5ML PO SYRP
10.0000 mL | ORAL_SOLUTION | ORAL | Status: DC | PRN
  Administered 2021-06-03: 10 mL via ORAL
  Filled 2021-06-03: qty 10

## 2021-06-03 MED ORDER — ACETAMINOPHEN 325 MG PO TABS: 650.0000 mg | ORAL_TABLET | Freq: Four times a day (QID) | ORAL | Status: DC | PRN

## 2021-06-03 MED ORDER — SODIUM CHLORIDE 0.9 % IV SOLN
100.0000 mg | Freq: Every day | INTRAVENOUS | Status: DC
  Administered 2021-06-04 – 2021-06-05 (×2): 100 mg via INTRAVENOUS
  Filled 2021-06-03 (×2): qty 20

## 2021-06-03 MED ORDER — METHYLPREDNISOLONE SODIUM SUCC 40 MG IJ SOLR
40.0000 mg | Freq: Two times a day (BID) | INTRAMUSCULAR | Status: DC
  Administered 2021-06-04 – 2021-06-05 (×3): 40 mg via INTRAVENOUS
  Filled 2021-06-03 (×3): qty 1

## 2021-06-03 MED ORDER — PREDNISONE 20 MG PO TABS: 50.0000 mg | ORAL_TABLET | Freq: Every day | ORAL | Status: DC

## 2021-06-03 MED ORDER — FOLIC ACID 1 MG PO TABS: 1.0000 mg | ORAL_TABLET | Freq: Every day | ORAL | Status: DC

## 2021-06-03 MED ORDER — ONDANSETRON HCL 4 MG/2ML IJ SOLN: 4.0000 mg | Freq: Four times a day (QID) | INTRAMUSCULAR | Status: DC | PRN

## 2021-06-03 NOTE — Progress Notes (Signed)
ANTICOAGULATION CONSULT NOTE   Pharmacy Consult for heparin Indication: pulmonary embolus  No Known Allergies  Patient Measurements: Height: 5' 9.75" (177.2 cm) Weight: 77.1 kg (170 lb) IBW/kg (Calculated) : 72.43 Heparin Dosing Weight: 78 kg  Vital Signs: Temp: 98.3 F (36.8 C) (11/13 0800) Temp Source: Oral (11/13 0800) BP: 136/84 (11/13 0800) Pulse Rate: 75 (11/13 0344)  Labs: Recent Labs    06/02/21 1834 06/03/21 0226 06/03/21 0512  HGB 13.8 12.9* 13.2  HCT 42.5 39.7 39.8  PLT 261 250 254  HEPARINUNFRC  --  0.31  --   CREATININE 0.91  --  0.90  TROPONINIHS 3  --   --      Estimated Creatinine Clearance: 84.9 mL/min (by C-G formula based on SCr of 0.9 mg/dL).   Medical History: Past Medical History:  Diagnosis Date   ETOH abuse      Assessment: 65 yo M with acute PE, no RHS per CT read. Patient has no history of anticoagulation. Pharmacy consulted to dose IV heparin.   Heparin level 0.27 and subtherapeutic. Hgb and platelets today are stable. No signs of bleeding or IV site issues per RN.   Goal of Therapy:  Heparin level 0.3-0.7 units/ml Monitor platelets by anticoagulation protocol: Yes   Plan:  Increase IV heparin gtt to 1550 units/hr Heparin level in 6 hours Daily heparin level, CBC F/u long term AC plan and ability to transition to PO  Thank you for involving pharmacy in this patient's care.  Arnette Felts, PharmD PGY1 Ambulatory Care Pharmacy Resident 06/03/2021 11:33 AM  **Pharmacist phone directory can be found on [email or web address] listed under [facility]**

## 2021-06-03 NOTE — Progress Notes (Signed)
PROGRESS NOTE  Brief Narrative: Garrett Holt is a 65 y.o. male with a history of EtOH abuse who presented to the ED 11/12 with cough, right lower pleuritic chest pain, and some shortness of breath that began 11/10. He was hypoxic to 85% on room air with CECT chest showing RLL PE and associated infarct without right heart strain. SARS-CoV-2 PCR was positive as well. Remdesivir, solumedrol, and IV heparin were started on admission earlier this morning.  Subjective: Feels his breathing and right chest discomfort have improved moderately from initiation of therapy. Denies bleeding.  Objective: BP 140/83 (BP Location: Left Arm)   Pulse 78   Temp 98.3 F (36.8 C) (Oral)   Resp 18   Ht 5' 9.75" (1.772 m)   Wt 77.1 kg   SpO2 94%   BMI 24.57 kg/m   Gen: No distress Pulm: Nonlabored with supplemental oxygen, diminished modestly at right base  CV: RRR, no murmur, no JVD, no edema GI: Soft, NT, ND, +BS  Neuro: Alert and oriented. No focal deficits. Skin: No rashes, lesions or ulcers on visualized skin  Assessment & Plan: Principal Problem:   Acute pulmonary embolism (HCC) Active Problems:   ETOH abuse   COVID-19 virus infection   Acute respiratory failure with hypoxia (HCC)  Acute hypoxic respiratory failure due to acute bilateral pulmonary emboli provoked by covid-19 infection:  - Continue supplemental oxygen as needed to maintain SpO2 >90% with normal WOB.  - Incentive spirometry, OOB  Acute bilateral pulmonary emboli with anterior segment of RLL pulmonary infarct: No RV strain by CT.  - Echocardiogram pending, note also had cardiomegaly on CT - Continue IV heparin pending echo results, can convert to DOAC vs. lovenox soon. - LE venous U/S pending  Breakthrough covid-19 infection: CRP grossly elevated at 16.6 with negative PCT. No typical infiltrates to suggest covid pneumonia or superimposed bacterial pneumonia at this time. s/p 4 Pfizer vaccinations, latest of which was August 2022.  The patient reports malaise, etc. preceding symptoms and suspected he had covid after exposure to family member. Not incidental.  - Continue airborne/contact precautions x10 days while admitted - Started remdesivir given early presentation, elevated inflammatory markers.  - With hypoxia, giving steroids, low threshold to quickly taper if no infiltrates develop.  Coronary calcification on CT: Troponin negative, CP not typical of cardiac etiology.  - Suggest risk factor modification and outpatient ischemic evaluation.   Tyrone Nine, MD Pager on amion 06/03/2021, 4:01 PM

## 2021-06-03 NOTE — Progress Notes (Signed)
ANTICOAGULATION CONSULT NOTE   Pharmacy Consult for heparin Indication: pulmonary embolus  No Known Allergies  Patient Measurements: Height: 5' 9.75" (177.2 cm) Weight: 77.1 kg (170 lb) IBW/kg (Calculated) : 72.43 Heparin Dosing Weight: 78 kg  Vital Signs: Temp: 98.4 F (36.9 C) (11/13 0022) Temp Source: Oral (11/13 0022) BP: 145/89 (11/13 0022) Pulse Rate: 80 (11/13 0022)  Labs: Recent Labs    06/02/21 1834 06/03/21 0226  HGB 13.8 12.9*  HCT 42.5 39.7  PLT 261 250  HEPARINUNFRC  --  0.31  CREATININE 0.91  --   TROPONINIHS 3  --      Estimated Creatinine Clearance: 84 mL/min (by C-G formula based on SCr of 0.91 mg/dL).   Medical History: Past Medical History:  Diagnosis Date   ETOH abuse     Medications: see MAR  Assessment: 65 yo M with acute PE, no RHS per CT read. No AC PTA. CBC ok.   Initial heparin level low end therapeutic on 1250 units/hr  Goal of Therapy:  Heparin level 0.3-0.7 units/ml Monitor platelets by anticoagulation protocol: Yes   Plan:  Increase heparin gtt to 1350 units/hr F/u 6 hour heparin level to confirm F/u long term AC plan and ability to transition to PO  Daylene Posey, PharmD Clinical Pharmacist ED Pharmacist Phone # [phone] 06/03/2021 3:15 AM

## 2021-06-03 NOTE — H&P (Signed)
History and Physical    Ed Mandich UYQ:[id number] DOB: 15-Jul-1956 DOA: 06/02/2021  PCP: Patient, No Pcp Per (Inactive)  Patient coming from: Home  I have personally briefly reviewed patient's old medical records in [facility]  Chief Complaint: CP  HPI: Garrett Holt is a 65 y.o. male with medical history significant of prior EtOH abuse last in March 2022.  Pt with onset of cough and R sided CP, sharp, worse with inspiration or coughing, onset on Thursday.  Symptoms persistent, nothing makes better.  No N/V, lightheadedness, palpitations.  No DOE.  No fevers, chills, abd pain.  Pt is vaccinated and boosted to COVID-19   ED Course: COVID-19 positive, new O2 requirement (satting 85% on RA), and CTA chest showing pulmonary emboli, moderate clot burden, no RHS.  Increased WOB on room air, improved on 2L [place].   Review of Systems: As per HPI, otherwise all review of systems negative.  Past Medical History:  Diagnosis Date   ETOH abuse     Past Surgical History:  Procedure Laterality Date   I & D EXTREMITY Left 04/13/2019   Procedure: IRRIGATION AND DEBRIDEMENT EXTREMITY;  Surgeon: Knute Neu, MD;  Location: [facility] OR;  Service: Plastics;  Laterality: Left;     reports that he has quit smoking. He has never used smokeless tobacco. He reports that he does not currently use alcohol. He reports that he does not use drugs.  No Known Allergies  Family History  Problem Relation Age of Onset   Immunodeficiency Neg Hx      Prior to Admission medications   Medication Sig Start Date End Date Taking? Authorizing Provider  acetaminophen (TYLENOL) 325 MG tablet Take 650 mg by mouth every 6 (six) hours as needed for mild pain.    [provider]  cephALEXin (KEFLEX) 500 MG capsule Take 1 capsule (500 mg total) by mouth 4 (four) times daily. 04/13/19   Knute Neu, MD    Physical Exam: Vitals:   06/02/21 2245 06/02/21 2300 06/03/21 0022 06/03/21 0030  BP:  (!) 146/86  (!) 145/89   Pulse: 77 78 80   Resp: (!) 29 (!) 23 18   Temp:   98.4 F (36.9 C)   TempSrc:   Oral   SpO2: 98% 97% 97%   Weight:    77.1 kg  Height:        Constitutional: NAD, calm, comfortable Eyes: PERRL, lids and conjunctivae normal ENMT: Mucous membranes are moist. Posterior pharynx clear of any exudate or lesions.Normal dentition.  Neck: normal, supple, no masses, no thyromegaly Respiratory: clear to auscultation bilaterally, no wheezing, no crackles. Normal respiratory effort. No accessory muscle use.  Cardiovascular: Regular rate and rhythm, no murmurs / rubs / gallops. No extremity edema. 2+ pedal pulses. No carotid bruits.  Abdomen: no tenderness, no masses palpated. No hepatosplenomegaly. Bowel sounds positive.  Musculoskeletal: no clubbing / cyanosis. No joint deformity upper and lower extremities. Good ROM, no contractures. Normal muscle tone.  Skin: no rashes, lesions, ulcers. No induration Neurologic: CN 2-12 grossly intact. Sensation intact, DTR normal. Strength 5/5 in all 4.  Psychiatric: Normal judgment and insight. Alert and oriented x 3. Normal mood.    Labs on Admission: I have personally reviewed following labs and imaging studies  CBC: Recent Labs  Lab 06/02/21 1834  WBC 10.0  HGB 13.8  HCT 42.5  MCV 86.9  PLT 261   Basic Metabolic Panel: Recent Labs  Lab 06/02/21 1834  NA 135  K 3.9  CL 101  CO2 24  GLUCOSE 104*  BUN 13  CREATININE 0.91  CALCIUM 9.1   GFR: Estimated Creatinine Clearance: 84 mL/min (by C-G formula based on SCr of 0.91 mg/dL). Liver Function Tests: No results for input(s): AST, ALT, ALKPHOS, BILITOT, PROT, ALBUMIN in the last 168 hours. No results for input(s): LIPASE, AMYLASE in the last 168 hours. No results for input(s): AMMONIA in the last 168 hours. Coagulation Profile: No results for input(s): INR, PROTIME in the last 168 hours. Cardiac Enzymes: No results for input(s): CKTOTAL, CKMB, CKMBINDEX, TROPONINI in the  last 168 hours. BNP (last 3 results) No results for input(s): PROBNP in the last 8760 hours. HbA1C: No results for input(s): HGBA1C in the last 72 hours. CBG: No results for input(s): GLUCAP in the last 168 hours. Lipid Profile: No results for input(s): CHOL, HDL, LDLCALC, TRIG, CHOLHDL, LDLDIRECT in the last 72 hours. Thyroid Function Tests: No results for input(s): TSH, T4TOTAL, FREET4, T3FREE, THYROIDAB in the last 72 hours. Anemia Panel: No results for input(s): VITAMINB12, FOLATE, FERRITIN, TIBC, IRON, RETICCTPCT in the last 72 hours. Urine analysis: No results found for: COLORURINE, APPEARANCEUR, LABSPEC, PHURINE, GLUCOSEU, HGBUR, BILIRUBINUR, KETONESUR, PROTEINUR, UROBILINOGEN, NITRITE, LEUKOCYTESUR  Radiological Exams on Admission: CT Chest W Contrast  Result Date: 06/02/2021 CLINICAL DATA:  Dyspnea, pleural effusion, hypoxia, cough EXAM: CT CHEST WITH CONTRAST TECHNIQUE: Multidetector CT imaging of the chest was performed during intravenous contrast administration. CONTRAST:  62mL OMNIPAQUE IOHEXOL 300 MG/ML  SOLN COMPARISON:  None. FINDINGS: Cardiovascular: There are multiple segmental intraluminal filling defects identified within all pulmonary lobes in keeping with acute pulmonary embolism. Moderate embolic burden. The central pulmonary arteries are not enlarged. Cardiac size is mildly, globally enlarged. No CT evidence of right heart strain, however. Mild coronary artery calcification. No pericardial effusion. The thoracic aorta is unremarkable. Mediastinum/Nodes: The visualized thyroid is unremarkable. No pathologic thoracic adenopathy. Esophagus is unremarkable. Small hiatal hernia. Lungs/Pleura: There is focal consolidation and ground-glass pulmonary infiltrate within the a anterior segment of the right lower lobe in keeping with a probable developing pulmonary infarct. Small right pleural effusion is present. Mild bibasilar atelectasis. No pneumothorax. Central airways are widely  patent. Upper Abdomen: No acute abnormality. Musculoskeletal: No acute bone abnormality. No lytic or blastic bone lesion. IMPRESSION: Acute pulmonary embolism. Moderate embolic burden. No CT evidence of right heart strain. Developing infarct within the right lower lobe. Small right pleural effusion, likely reactive in nature. Mild coronary artery calcification.  Mild cardiomegaly. Small hiatal hernia. These results were called by telephone at the time of interpretation on 06/02/2021 at 8:05 pm to provider AMJAD ALI , who verbally acknowledged these results. Electronically Signed   By: Helyn Numbers M.D.   On: 06/02/2021 20:08   DG Chest Portable 1 View  Result Date: 06/02/2021 CLINICAL DATA:  Cough and chest pain. EXAM: PORTABLE CHEST 1 VIEW COMPARISON:  None. FINDINGS: The heart is mildly enlarged. There are small bilateral pleural effusions. There is some strandy opacities in both lung bases. There is no pneumothorax or acute fracture. IMPRESSION: 1. Cardiomegaly. 2. Small bilateral pleural effusions with bibasilar atelectasis/airspace disease. Electronically Signed   By: Darliss Cheney M.D.   On: 06/02/2021 18:11    EKG: Independently reviewed.  Assessment/Plan Principal Problem:   Acute pulmonary embolism (HCC) Active Problems:   ETOH abuse   COVID-19 virus infection   Acute respiratory failure with hypoxia (HCC)    Acute PE - Heparin gtt 2d echo US BLE Tele monitor COVID-19 -  Covid pathway Remdesivir Steroids Daily labs Acute resp failure with hypoxia - New O2 requirement either due to COVID-19 and/or acute PE RLL anterior ground glass findings = either pulmonary infarct, or COVID-19 O2 via [place] Cont pulse ox Treatment for each as above H/o EtOH abuse None since March 2022  DVT prophylaxis: Heparin gtt Code Status: Full Family Communication: No family in room Disposition Plan: Home after O2 requirement improved, treatment of PE Consults called: None Admission status:  Admit to inpatient  Severity of Illness: The appropriate patient status for this patient is INPATIENT. Inpatient status is judged to be reasonable and necessary in order to provide the required intensity of service to ensure the patient's safety. The patient's presenting symptoms, physical exam findings, and initial radiographic and laboratory data in the context of their chronic comorbidities is felt to place them at high risk for further clinical deterioration. Furthermore, it is not anticipated that the patient will be medically stable for discharge from the hospital within 2 midnights of admission.   * I certify that at the point of admission it is my clinical judgment that the patient will require inpatient hospital care spanning beyond 2 midnights from the point of admission due to high intensity of service, high risk for further deterioration and high frequency of surveillance required.*   Darion Milewski M. DO [facility]  How to contact the [facility] Attending or Consulting provider 7A - 7P or covering provider during after hours 7P -7A, for this patient?  Check the care team in [facility] and look for a) attending/consulting [facility] provider listed and b) the [facility] team listed Log into [email or web address]  [employer] Physician Scheduling and messaging for groups and whole hospitals  On call and physician scheduling software for group practices, residents, hospitalists and other medical providers for call, clinic, rotation and shift schedules. On[employer] is a hospital-wide system for scheduling doctors and paging doctors on call. EasyPlot is for scientific plotting and data analysis.  [email or web address]  and use [facility]'s universal password to access. If you do not have the password, please contact the hospital operator.  Locate the [facility] provider you are looking for under [facility] and page to a number that you can be directly reached. If you still have difficulty reaching the provider, please page  the [facility] (Director on Call) for the Hospitalists listed on amion for assistance.  06/03/2021, 12:53 AM

## 2021-06-04 ENCOUNTER — Inpatient Hospital Stay (HOSPITAL_COMMUNITY): Payer: Non-veteran care

## 2021-06-04 ENCOUNTER — Other Ambulatory Visit (HOSPITAL_COMMUNITY): Payer: Self-pay

## 2021-06-04 DIAGNOSIS — J9601 Acute respiratory failure with hypoxia: Secondary | ICD-10-CM

## 2021-06-04 DIAGNOSIS — I2699 Other pulmonary embolism without acute cor pulmonale: Secondary | ICD-10-CM

## 2021-06-04 DIAGNOSIS — U071 COVID-19: Secondary | ICD-10-CM | POA: Diagnosis not present

## 2021-06-04 LAB — COMPREHENSIVE METABOLIC PANEL
ALT: 25 U/L (ref 0–44)
AST: 21 U/L (ref 15–41)
Albumin: 3.2 g/dL — ABNORMAL LOW (ref 3.5–5.0)
Alkaline Phosphatase: 95 U/L (ref 38–126)
Anion gap: 9 (ref 5–15)
BUN: 17 mg/dL (ref 8–23)
CO2: 24 mmol/L (ref 22–32)
Calcium: 9.2 mg/dL (ref 8.9–10.3)
Chloride: 102 mmol/L (ref 98–111)
Creatinine, Ser: 0.97 mg/dL (ref 0.61–1.24)
GFR, Estimated: >60 mL/min (ref >60)
Glucose, Bld: 158 mg/dL — ABNORMAL HIGH (ref 70–99)
Potassium: 4.3 mmol/L (ref 3.5–5.1)
Sodium: 135 mmol/L (ref 135–145)
Total Bilirubin: 0.5 mg/dL (ref 0.3–1.2)
Total Protein: 7.8 g/dL (ref 6.5–8.1)

## 2021-06-04 LAB — CBC
HCT: 41.5 % (ref 39.0–52.0)
Hemoglobin: 13.7 g/dL (ref 13.0–17.0)
MCH: 27.8 pg (ref 26.0–34.0)
MCHC: 33 g/dL (ref 30.0–36.0)
MCV: 84.3 fL (ref 80.0–100.0)
Platelets: 322 10*3/uL (ref 150–400)
RBC: 4.92 MIL/uL (ref 4.22–5.81)
RDW: 13.2 % (ref 11.5–15.5)
WBC: 12.5 10*3/uL — ABNORMAL HIGH (ref 4.0–10.5)
nRBC: 0 % (ref 0.0–0.2)

## 2021-06-04 LAB — ECHOCARDIOGRAM COMPLETE
AR max vel: 2.12 cm2
AV Peak grad: 7.8 mmHg
Ao pk vel: 1.4 m/s
Area-P 1/2: 3.21 cm2
Calc EF: 53.3 %
Height: 69.75 in
S' Lateral: 2.9 cm
Single Plane A2C EF: 52.6 %
Single Plane A4C EF: 53.4 %
Weight: 2720 oz

## 2021-06-04 LAB — C-REACTIVE PROTEIN: CRP: 15.2 mg/dL — ABNORMAL HIGH (ref <1.0)

## 2021-06-04 LAB — HEPARIN LEVEL (UNFRACTIONATED): Heparin Unfractionated: 0.45 IU/mL (ref 0.30–0.70)

## 2021-06-04 MED ORDER — APIXABAN 5 MG PO TABS
10.0000 mg | ORAL_TABLET | Freq: Two times a day (BID) | ORAL | Status: DC
  Administered 2021-06-04 – 2021-06-05 (×2): 10 mg via ORAL
  Filled 2021-06-04 (×2): qty 2

## 2021-06-04 MED ORDER — APIXABAN 5 MG PO TABS: 5.0000 mg | ORAL_TABLET | Freq: Two times a day (BID) | ORAL | Status: DC

## 2021-06-04 NOTE — Progress Notes (Signed)
Pharmacy Consult - > Heparin to Eliquis  With pulmonary embolus  Plan: Eliquis 10 mg po BID x 7 days then 5 mg po BID Follow CBC  Thank you Okey Regal, PharmD

## 2021-06-04 NOTE — Progress Notes (Signed)
PROGRESS NOTE  Garrett Holt  DSK:[id number] DOB: [birth year] DOA: 06/02/2021 PCP: Patient, No Pcp Per (Inactive)   Brief Narrative: Garrett Holt is a 65 y.o. male with a history of EtOH abuse who presented to the ED 11/12 with cough, right lower pleuritic chest pain, and some shortness of breath that began 11/10. He was hypoxic to 85% on room air with CECT chest showing RLL PE and associated infarct without right heart strain. SARS-CoV-2 PCR was positive with elevated inflammatory markers. Remdesivir, solumedrol, and IV heparin were started on admission with echocardiogram pending.  Assessment & Plan: Principal Problem:   Acute pulmonary embolism (HCC) Active Problems:   ETOH abuse   COVID-19 virus infection   Acute respiratory failure with hypoxia (HCC)  Acute hypoxic respiratory failure due to acute bilateral pulmonary emboli provoked by covid-19 infection:  - Continue supplemental oxygen as needed to maintain SpO2 >90% with normal WOB.  - Incentive spirometry, OOB   Acute bilateral pulmonary emboli with anterior segment of RLL pulmonary infarct: No RV strain by CT.  - Echocardiogram pending, note also had cardiomegaly on CT - Continue IV heparin pending echo results, will plan to convert to DOAC once RV strain definitively ruled out. - LE venous U/S still pending   Breakthrough covid-19 infection: CRP grossly elevated at 16.6 with negative PCT. No typical infiltrates to suggest covid pneumonia or superimposed bacterial pneumonia at this time. s/p 4 Pfizer vaccinations, latest of which was August 2022. The patient reports malaise, etc. preceding symptoms and suspected he had covid after exposure to family member. Not incidental.  - Continue airborne/contact precautions x10 days while admitted - Started remdesivir given early presentation, elevated inflammatory markers.  - With hypoxia and persistently elevated CRP, will continue IV steroids for now. Will trend this.   Coronary calcification  on CT: Troponin negative, CP not typical of cardiac etiology.  - Suggest risk factor modification and outpatient ischemic evaluation.   DVT prophylaxis: Heparin IV Code Status: Full Family Communication: None at bedside Disposition Plan:  Status is: Inpatient  Remains inpatient appropriate because: Continue inpatient management of pulmonary embolism with infarction and symptomatic covid-19 infection with evidence of ongoing inflammation despite IV steroids.   Consultants:  None  Procedures:  None  Antimicrobials: Remdesivir   Subjective: Chest pain in right lower chest is improved, still present with deep breaths. Dyspnea has improved significantly. No bleeding.   Objective: Vitals:   06/04/21 0407 06/04/21 0755 06/04/21 1107 06/04/21 1108  BP: 135/84 133/84  (!) 142/86  Pulse: 64 76 69   Resp: 17 17 18    Temp: 97.8 F (36.6 C) 98 F (36.7 C) 97.8 F (36.6 C)   TempSrc: Oral Oral Oral   SpO2: 92% 97% 94%   Weight:      Height:        Intake/Output Summary (Last 24 hours) at 06/04/2021 1355 Last data filed at 06/04/2021 0406 Gross per 24 hour  Intake 155.33 ml  Output 1080 ml  Net -924.67 ml   Filed Weights   06/02/21 1642 06/03/21 0030  Weight: 78 kg 77.1 kg    Gen: 65 y.o. male in no distress  Pulm: Non-labored breathing, crackles at right base. No wheezes.  CV: Regular rate and rhythm. No murmur, rub, or gallop. No JVD, no significant pedal edema. GI: Abdomen soft, non-tender, non-distended, with normoactive bowel sounds. No organomegaly or masses felt. Ext: Warm, no deformities Skin: No rashes, lesions or ulcers or bleeding on visualized skin. Neuro: Alert and oriented.  No focal neurological deficits. Psych: Judgement and insight appear normal. Mood & affect appropriate.   Data Reviewed: I have personally reviewed following labs and imaging studies  CBC: Recent Labs  Lab 06/02/21 1834 06/03/21 0226 06/03/21 0512 06/04/21 0131  WBC 10.0 9.3 10.0  12.5*  NEUTROABS  --   --  8.9*  --   HGB 13.8 12.9* 13.2 13.7  HCT 42.5 39.7 39.8 41.5  MCV 86.9 86.5 84.9 84.3  PLT 261 250 254 322   Basic Metabolic Panel: Recent Labs  Lab 06/02/21 1834 06/03/21 0512 06/04/21 0131  NA 135 133* 135  K 3.9 4.1 4.3  CL 101 100 102  CO2 24 24 24   GLUCOSE 104* 156* 158*  BUN 13 10 17   CREATININE 0.91 0.90 0.97  CALCIUM 9.1 8.7* 9.2   GFR: Estimated Creatinine Clearance: 78.8 mL/min (by C-G formula based on SCr of 0.97 mg/dL). Liver Function Tests: Recent Labs  Lab 06/03/21 0512 06/04/21 0131  AST 22 21  ALT 29 25  ALKPHOS 99 95  BILITOT 0.3 0.5  PROT 7.3 7.8  ALBUMIN 3.3* 3.2*   No results for input(s): LIPASE, AMYLASE in the last 168 hours. No results for input(s): AMMONIA in the last 168 hours. Coagulation Profile: No results for input(s): INR, PROTIME in the last 168 hours. Cardiac Enzymes: No results for input(s): CKTOTAL, CKMB, CKMBINDEX, TROPONINI in the last 168 hours. BNP (last 3 results) No results for input(s): PROBNP in the last 8760 hours. HbA1C: No results for input(s): HGBA1C in the last 72 hours. CBG: No results for input(s): GLUCAP in the last 168 hours. Lipid Profile: No results for input(s): CHOL, HDL, LDLCALC, TRIG, CHOLHDL, LDLDIRECT in the last 72 hours. Thyroid Function Tests: No results for input(s): TSH, T4TOTAL, FREET4, T3FREE, THYROIDAB in the last 72 hours. Anemia Panel: No results for input(s): VITAMINB12, FOLATE, FERRITIN, TIBC, IRON, RETICCTPCT in the last 72 hours. Urine analysis: No results found for: COLORURINE, APPEARANCEUR, LABSPEC, PHURINE, GLUCOSEU, HGBUR, BILIRUBINUR, KETONESUR, PROTEINUR, UROBILINOGEN, NITRITE, LEUKOCYTESUR Recent Results (from the past 240 hour(s))  Resp Panel by RT-PCR (Flu A&B, Covid) Nasopharyngeal Swab     Status: Abnormal   Collection Time: 06/02/21  6:34 PM   Specimen: Nasopharyngeal Swab; Nasopharyngeal(NP) swabs in vial transport medium  Result Value Ref Range  Status   SARS Coronavirus 2 by RT PCR POSITIVE (A) NEGATIVE Final    Comment: RESULT CALLED TO, READ BACK BY AND VERIFIED WITH:  POWELL, V RN @2011  06/02/21 EDENSCA (NOTE) SARS-CoV-2 target nucleic acids are DETECTED.  The SARS-CoV-2 RNA is generally detectable in upper respiratory specimens during the acute phase of infection. Positive results are indicative of the presence of the identified virus, but do not rule out bacterial infection or co-infection with other pathogens not detected by the test. Clinical correlation with patient history and other diagnostic information is necessary to determine patient infection status. The expected result is Negative.  Fact Sheet for Patients: 13/12/22  Fact Sheet for Healthcare Providers:  This test is not yet approved or cleared by the 13/12/22 FDA and  has been authorized for detection and/or diagnosis of SARS-CoV-2 by FDA under an Emergency Use Authorization (EUA).  This EUA will remain in effect (meaning this test can  be used) for the duration of  the COVID-19 declaration under Section 564(b)(1) of the Act, 21 U.S.C. section 360bbb-3(b)(1), unless the authorization is terminated or revoked sooner.     Influenza A by PCR NEGATIVE NEGATIVE Final  Influenza B by PCR NEGATIVE NEGATIVE Final    Comment: (NOTE) The Xpert Xpress SARS-CoV-2/FLU/RSV plus assay is intended as an aid in the diagnosis of influenza from Nasopharyngeal swab specimens and should not be used as a sole basis for treatment. Nasal washings and aspirates are unacceptable for Xpert Xpress SARS-CoV-2/FLU/RSV testing.  Fact Sheet for Patients: BloggerCourse.com  Fact Sheet for Healthcare Providers: SeriousBroker.it  This test is not yet approved or cleared by the [place] FDA and has been authorized for detection and/or diagnosis of  SARS-CoV-2 by FDA under an Emergency Use Authorization (EUA). This EUA will remain in effect (meaning this test can be used) for the duration of the COVID-19 declaration under Section 564(b)(1) of the Act, 21 U.S.C. section 360bbb-3(b)(1), unless the authorization is terminated or revoked.  Performed at [facility], [address], [place], [place] [address]       Radiology Studies: CT Chest W Contrast  Result Date: 06/02/2021 CLINICAL DATA:  Dyspnea, pleural effusion, hypoxia, cough EXAM: CT CHEST WITH CONTRAST TECHNIQUE: Multidetector CT imaging of the chest was performed during intravenous contrast administration. CONTRAST:  65mL OMNIPAQUE IOHEXOL 300 MG/ML  SOLN COMPARISON:  None. FINDINGS: Cardiovascular: There are multiple segmental intraluminal filling defects identified within all pulmonary lobes in keeping with acute pulmonary embolism. Moderate embolic burden. The central pulmonary arteries are not enlarged. Cardiac size is mildly, globally enlarged. No CT evidence of right heart strain, however. Mild coronary artery calcification. No pericardial effusion. The thoracic aorta is unremarkable. Mediastinum/Nodes: The visualized thyroid is unremarkable. No pathologic thoracic adenopathy. Esophagus is unremarkable. Small hiatal hernia. Lungs/Pleura: There is focal consolidation and ground-glass pulmonary infiltrate within the a anterior segment of the right lower lobe in keeping with a probable developing pulmonary infarct. Small right pleural effusion is present. Mild bibasilar atelectasis. No pneumothorax. Central airways are widely patent. Upper Abdomen: No acute abnormality. Musculoskeletal: No acute bone abnormality. No lytic or blastic bone lesion. IMPRESSION: Acute pulmonary embolism. Moderate embolic burden. No CT evidence of right heart strain. Developing infarct within the right lower lobe. Small right pleural effusion, likely reactive in nature. Mild coronary artery  calcification.  Mild cardiomegaly. Small hiatal hernia. These results were called by telephone at the time of interpretation on 06/02/2021 at 8:05 pm to provider AMJAD ALI , who verbally acknowledged these results. Electronically Signed   By: Helyn Numbers M.D.   On: 06/02/2021 20:08   DG Chest Portable 1 View  Result Date: 06/02/2021 CLINICAL DATA:  Cough and chest pain. EXAM: PORTABLE CHEST 1 VIEW COMPARISON:  None. FINDINGS: The heart is mildly enlarged. There are small bilateral pleural effusions. There is some strandy opacities in both lung bases. There is no pneumothorax or acute fracture. IMPRESSION: 1. Cardiomegaly. 2. Small bilateral pleural effusions with bibasilar atelectasis/airspace disease. Electronically Signed   By: Darliss Cheney M.D.   On: 06/02/2021 18:11    Scheduled Meds:  methylPREDNISolone (SOLU-MEDROL) injection  40 mg Intravenous Q12H   Continuous Infusions:  heparin 1,550 Units/hr (06/04/21 0456)   remdesivir 100 mg in NS 100 mL 100 mg (06/04/21 0830)     LOS: 1 day   Time spent: 25 minutes.  Tyrone Nine, MD [facility] [email or web address] 06/04/2021, 1:55 PM

## 2021-06-04 NOTE — Progress Notes (Signed)
ANTICOAGULATION CONSULT NOTE   Pharmacy Consult for heparin Indication: pulmonary embolus  Allergies  Allergen Reactions   Lac Bovis Diarrhea    Patient Measurements: Height: 5' 9.75" (177.2 cm) Weight: 77.1 kg (170 lb) IBW/kg (Calculated) : 72.43 Heparin Dosing Weight: 78 kg  Vital Signs: Temp: 97.8 F (36.6 C) (11/14 0407) Temp Source: Oral (11/14 0407) BP: 135/84 (11/14 0407) Pulse Rate: 64 (11/14 0407)  Labs: Recent Labs    06/02/21 1834 06/03/21 0226 06/03/21 0512 06/03/21 0938 06/04/21 0131  HGB 13.8 12.9* 13.2  --  13.7  HCT 42.5 39.7 39.8  --  41.5  PLT 261 250 254  --  322  HEPARINUNFRC  --  0.31  --  0.27* 0.45  CREATININE 0.91  --  0.90  --  0.97  TROPONINIHS 3  --   --   --   --      Estimated Creatinine Clearance: 78.8 mL/min (by C-G formula based on SCr of 0.97 mg/dL).   Medical History: Past Medical History:  Diagnosis Date   ETOH abuse      Assessment: 65 yo M with acute PE, no RHS per CT read. Patient has no history of anticoagulation. Pharmacy consulted to dose IV heparin.   Heparin level is therapeutic at 0.45, on 1550 units/hr. Hgb 13.7, plt 322. No s/sx of bleeding or infusion issues.   Goal of Therapy:  Heparin level 0.3-0.7 units/ml Monitor platelets by anticoagulation protocol: Yes   Plan:  Continue IV heparin gtt to 1550 units/hr Daily heparin level, CBC F/u long term AC plan and ability to transition to PO  Thank you for involving pharmacy in this patient's care.  Sherron Monday, PharmD, BCCCP Clinical Pharmacist  Phone: [phone] 06/04/2021 7:36 AM  Please check AMION for all [facility] phone numbers After 10:00 PM, call Main Pharmacy [phone]

## 2021-06-04 NOTE — Progress Notes (Signed)
VASCULAR LAB    Bilateral lower extremity venous duplex has been performed.  See CV proc for preliminary results.   Kahla Risdon, RVT 06/04/2021, 12:38 PM

## 2021-06-05 ENCOUNTER — Other Ambulatory Visit (HOSPITAL_COMMUNITY): Payer: Self-pay

## 2021-06-05 LAB — COMPREHENSIVE METABOLIC PANEL
ALT: 34 U/L (ref 0–44)
AST: 33 U/L (ref 15–41)
Albumin: 3.3 g/dL — ABNORMAL LOW (ref 3.5–5.0)
Alkaline Phosphatase: 100 U/L (ref 38–126)
Anion gap: 10 (ref 5–15)
BUN: 26 mg/dL — ABNORMAL HIGH (ref 8–23)
CO2: 24 mmol/L (ref 22–32)
Calcium: 9.4 mg/dL (ref 8.9–10.3)
Chloride: 100 mmol/L (ref 98–111)
Creatinine, Ser: 1.01 mg/dL (ref 0.61–1.24)
GFR, Estimated: >60 mL/min (ref >60)
Glucose, Bld: 131 mg/dL — ABNORMAL HIGH (ref 70–99)
Potassium: 4.5 mmol/L (ref 3.5–5.1)
Sodium: 134 mmol/L — ABNORMAL LOW (ref 135–145)
Total Bilirubin: 0.4 mg/dL (ref 0.3–1.2)
Total Protein: 7.6 g/dL (ref 6.5–8.1)

## 2021-06-05 LAB — C-REACTIVE PROTEIN: CRP: 6 mg/dL — ABNORMAL HIGH (ref <1.0)

## 2021-06-05 MED ORDER — APIXABAN (ELIQUIS) VTE STARTER PACK (10MG AND 5MG)
ORAL_TABLET | ORAL | 0 refills | Status: AC
Start: 2021-06-05 — End: ?
  Filled 2021-06-05: qty 74, 30d supply, fill #0

## 2021-06-05 NOTE — Discharge Instructions (Addendum)
Information on my medicine - ELIQUIS (apixaban)  Why was Eliquis prescribed for you? Eliquis was prescribed to treat blood clots that may have been found in the veins of your legs (deep vein thrombosis) or in your lungs (pulmonary embolism) and to reduce the risk of them occurring again.  What do You need to know about Eliquis ? The starting dose is 10 mg (two 5 mg tablets) taken TWICE daily for the FIRST SEVEN (7) DAYS, then on 06/11/21  the dose is reduced to ONE 5 mg tablet taken TWICE daily.  Eliquis may be taken with or without food.   Try to take the dose about the same time in the morning and in the evening. If you have difficulty swallowing the tablet whole please discuss with your pharmacist how to take the medication safely.  Take Eliquis exactly as prescribed and DO NOT stop taking Eliquis without talking to the doctor who prescribed the medication.  Stopping may increase your risk of developing a new blood clot.  Refill your prescription before you run out.  After discharge, you should have regular check-up appointments with your healthcare provider that is prescribing your Eliquis.    What do you do if you miss a dose? If a dose of ELIQUIS is not taken at the scheduled time, take it as soon as possible on the same day and twice-daily administration should be resumed. The dose should not be doubled to make up for a missed dose.  Important Safety Information A possible side effect of Eliquis is bleeding. You should call your healthcare provider right away if you experience any of the following: Bleeding from an injury or your nose that does not stop. Unusual colored urine (red or dark brown) or unusual colored stools (red or black). Unusual bruising for unknown reasons. A serious fall or if you hit your head (even if there is no bleeding).  Some medicines may interact with Eliquis and might increase your risk of bleeding or clotting while on Eliquis. To help avoid this,  consult your healthcare provider or pharmacist prior to using any new prescription or non-prescription medications, including herbals, vitamins, non-steroidal anti-inflammatory drugs (NSAIDs) and supplements.  This website has more information on Eliquis (apixaban): http://www.eliquis.com/eliquis/home

## 2021-06-05 NOTE — Care Management (Addendum)
11-15 -22 0939 Case Manager received a consult for Eliquis and Xarelto. Patient has Medicaid and cost will be no more than $4.00. Please send all new medications to [facility]. No further needs from Case Manager at this time.   1120 06-05-21 Case Manager spoke with patient and he states the Medicaid is no longer active. Medicaid is listed for the patient in Epic. Patient states he goes to the [facility]. Case Manager will call Vilinda Boehringer to make them aware that the patient is hospitalized. D/c Summary to be faxed to the provider.

## 2021-06-05 NOTE — Discharge Summary (Signed)
Physician Discharge Summary  Calhoun Troxell WYO:[id number] DOB: 08/11/1955 DOA: 06/02/2021  PCP: Patient, No Pcp Per (Inactive)  Admit date: 06/02/2021 Discharge date: 06/05/2021  Admitted From: Home Disposition: Home   Recommendations for Outpatient Follow-up:  Follow up with PCP at [facility] in next couple weeks. Will continue eliquis for 3-6 months for PE provoked by covid-19 infection. Consider cardiology evaluation for ischemic risk stratification.  Home Health: None Equipment/Devices: None Discharge Condition: Stable CODE STATUS: Full Diet recommendation: Heart healthy  Brief/Interim Summary: Garrett Holt is a 65 y.o. male with a history of EtOH abuse who presented to the ED 11/12 with cough, right lower pleuritic chest pain, and some shortness of breath that began 11/10. He was hypoxic to 85% on room air with CECT chest showing RLL PE and associated infarct without right heart strain. SARS-CoV-2 PCR was positive with elevated inflammatory markers. Remdesivir, solumedrol, and IV heparin were started on admission with echocardiogram pending. This ultimately confirmed the absence of right heart strain. No lower extremity DVTs were noted on venous U/S. Anticoagulation was transitioned to eliquis with no complications. The patient has symptomatically improved, hypoxia resolved, and inflammatory markers declined as anticipated. He is stable for discharge.   Discharge Diagnoses:  Principal Problem:   Acute pulmonary embolism (HCC) Active Problems:   ETOH abuse   COVID-19 virus infection   Acute respiratory failure with hypoxia (HCC)  Acute hypoxic respiratory failure due to acute bilateral pulmonary emboli provoked by covid-19 infection: Resolved.    Acute bilateral pulmonary emboli with anterior segment of RLL pulmonary infarct: No RV strain by CT or echo. No DVT's in LE's by U/S.  - Transitioned heparin to eliquis which has been well tolerated and will be affordable per CM. Education  provided by pharmacy.    Breakthrough covid-19 infection: CRP grossly elevated at 16.6 with negative PCT. No typical infiltrates to suggest covid pneumonia or superimposed bacterial pneumonia at this time. s/p 4 Pfizer vaccinations, latest of which was August 2022.  - Completed 3 days remdesivir and steroids with improvement in symptoms and diminution of inflammatory markers. Will not continue steroids at discharge given no evidence of hypoxia due to pneumonia.  - Continue airborne/contact precautions x10 days    Coronary calcification on CT: Troponin negative, CP not typical of cardiac etiology.  - Suggest risk factor modification and outpatient ischemic evaluation.    Discharge Instructions Discharge Instructions     Diet - low sodium heart healthy   Complete by: As directed    Discharge instructions   Complete by: As directed    Remain in isolation for 10 days from your diagnosis with covid-19. You have completed the treatments for covid-19 and should continue slowly feeling better. If you feel worse, seek medical attention right away.   Continue taking eliquis to allow the blood clots in your lungs to dissolve over the next 3-6 months. If you experience uncontrolled bleeding or worsening in your shortness of breath or chest pain, seek medical attention right away. Otherwise, follow up with your doctor at the [facility] in the next couple weeks. You may benefit from an evaluation by cardiology to make sure there is no narrowing of the arteries in your heart.   MyChart COVID-19 home monitoring program   Complete by: Jun 05, 2021    Is the patient willing to use the MyChart Mobile App for home monitoring?: Yes      Allergies as of 06/05/2021       Reactions   Lac  Bovis Diarrhea        Medication List     STOP taking these medications    acetaminophen 325 MG tablet Commonly known as: TYLENOL   cephALEXin 500 MG capsule Commonly known as: Keflex       TAKE these  medications    Apixaban Starter Pack (10mg  and 5mg ) Commonly known as: ELIQUIS STARTER PACK Take as directed on package: start with two-5mg  tablets twice daily for 7 days. On day 8, switch to one-5mg  tablet twice daily.   naltrexone 50 MG tablet Commonly known as: DEPADE Take 50-150 mg by mouth See admin instructions. 150mg  every morning and 50mg  every night        Follow-up Information     Clinic, [facility] Follow up.   Contact information: [address] [facility] [address] [phone]                Allergies  Allergen Reactions   Lac Bovis Diarrhea    Consultations: None  Procedures/Studies: CT Chest W Contrast  Result Date: 06/02/2021 CLINICAL DATA:  Dyspnea, pleural effusion, hypoxia, cough EXAM: CT CHEST WITH CONTRAST TECHNIQUE: Multidetector CT imaging of the chest was performed during intravenous contrast administration. CONTRAST:  67mL OMNIPAQUE IOHEXOL 300 MG/ML  SOLN COMPARISON:  None. FINDINGS: Cardiovascular: There are multiple segmental intraluminal filling defects identified within all pulmonary lobes in keeping with acute pulmonary embolism. Moderate embolic burden. The central pulmonary arteries are not enlarged. Cardiac size is mildly, globally enlarged. No CT evidence of right heart strain, however. Mild coronary artery calcification. No pericardial effusion. The thoracic aorta is unremarkable. Mediastinum/Nodes: The visualized thyroid is unremarkable. No pathologic thoracic adenopathy. Esophagus is unremarkable. Small hiatal hernia. Lungs/Pleura: There is focal consolidation and ground-glass pulmonary infiltrate within the a anterior segment of the right lower lobe in keeping with a probable developing pulmonary infarct. Small right pleural effusion is present. Mild bibasilar atelectasis. No pneumothorax. Central airways are widely patent. Upper Abdomen: No acute abnormality. Musculoskeletal: No acute bone abnormality. No  lytic or blastic bone lesion. IMPRESSION: Acute pulmonary embolism. Moderate embolic burden. No CT evidence of right heart strain. Developing infarct within the right lower lobe. Small right pleural effusion, likely reactive in nature. Mild coronary artery calcification.  Mild cardiomegaly. Small hiatal hernia. These results were called by telephone at the time of interpretation on 06/02/2021 at 8:05 pm to provider AMJAD ALI , who verbally acknowledged these results. Electronically Signed   By: [phone] M.D.   On: 06/02/2021 20:08   DG Chest Portable 1 View  Result Date: 06/02/2021 CLINICAL DATA:  Cough and chest pain. EXAM: PORTABLE CHEST 1 VIEW COMPARISON:  None. FINDINGS: The heart is mildly enlarged. There are small bilateral pleural effusions. There is some strandy opacities in both lung bases. There is no pneumothorax or acute fracture. IMPRESSION: 1. Cardiomegaly. 2. Small bilateral pleural effusions with bibasilar atelectasis/airspace disease. Electronically Signed   By: 13/06/2021 M.D.   On: 06/02/2021 18:11   ECHOCARDIOGRAM COMPLETE  Result Date: 06/04/2021    ECHOCARDIOGRAM REPORT   Patient Name:   Garrett Holt  Date of Exam: 06/04/2021 Medical Rec #:  Darliss Cheney  Height:       69.8 in Accession #:    13/06/2021 Weight:       170.0 lb Date of Birth:  [birth year]  BSA:          1.943 m Patient Age:    65 years   BP:  140/84 mmHg Patient Gender: M          HR:           66 bpm. Exam Location:  Inpatient Procedure: 2D Echo, Cardiac Doppler and Color Doppler Indications:    Pulmonary Embolism  History:        Patient has no prior history of Echocardiogram examinations.  Sonographer:    Cleatis Polka Referring Phys: [phone] JARED M GARDNER IMPRESSIONS  1. Left ventricular ejection fraction, by estimation, is 50 to 55%. The left ventricle has low normal function. The left ventricle has no regional wall motion abnormalities. There is mild concentric left ventricular hypertrophy. Left  ventricular diastolic parameters were normal.  2. Right ventricular systolic function is normal. The right ventricular size is normal. Tricuspid regurgitation signal is inadequate for assessing PA pressure.  3. The mitral valve is normal in structure. Trivial mitral valve regurgitation. No evidence of mitral stenosis.  4. The aortic valve is normal in structure. Aortic valve regurgitation is not visualized. No aortic stenosis is present.  5. The inferior vena cava is normal in size with greater than 50% respiratory variability, suggesting right atrial pressure of 3 mmHg. Comparison(s): No prior Echocardiogram. FINDINGS  Left Ventricle: Left ventricular ejection fraction, by estimation, is 50 to 55%. The left ventricle has low normal function. The left ventricle has no regional wall motion abnormalities. The left ventricular internal cavity size was normal in size. There is mild concentric left ventricular hypertrophy. Left ventricular diastolic parameters were normal. Right Ventricle: The right ventricular size is normal. No increase in right ventricular wall thickness. Right ventricular systolic function is normal. Tricuspid regurgitation signal is inadequate for assessing PA pressure. Left Atrium: Left atrial size was normal in size. Right Atrium: Right atrial size was normal in size. Pericardium: There is no evidence of pericardial effusion. Mitral Valve: The mitral valve is normal in structure. Trivial mitral valve regurgitation. No evidence of mitral valve stenosis. Tricuspid Valve: The tricuspid valve is normal in structure. Tricuspid valve regurgitation is not demonstrated. No evidence of tricuspid stenosis. Aortic Valve: The aortic valve is normal in structure. Aortic valve regurgitation is not visualized. No aortic stenosis is present. Aortic valve peak gradient measures 7.8 mmHg. Pulmonic Valve: The pulmonic valve was normal in structure. Pulmonic valve regurgitation is not visualized. No evidence of  pulmonic stenosis. Aorta: The aortic root is normal in size and structure. Venous: The inferior vena cava is normal in size with greater than 50% respiratory variability, suggesting right atrial pressure of 3 mmHg. IAS/Shunts: No atrial level shunt detected by color flow Doppler.  LEFT VENTRICLE PLAX 2D LVIDd:         4.40 cm      Diastology LVIDs:         2.90 cm      LV e' medial:    6.20 cm/s LV PW:         1.10 cm      LV E/e' medial:  9.9 LV IVS:        1.10 cm      LV e' lateral:   7.29 cm/s LVOT diam:     2.00 cm      LV E/e' lateral: 8.4 LV SV:         67 LV SV Index:   34 LVOT Area:     3.14 cm  LV Volumes (MOD) LV vol d, MOD A2C: 101.0 ml LV vol d, MOD A4C: 99.5 ml LV vol s, MOD A2C:  47.9 ml LV vol s, MOD A4C: 46.4 ml LV SV MOD A2C:     53.1 ml LV SV MOD A4C:     99.5 ml LV SV MOD BP:      55.1 ml RIGHT VENTRICLE            IVC RV Basal diam:  3.40 cm    IVC diam: 1.70 cm RV Mid diam:    2.70 cm RV S prime:     9.68 cm/s TAPSE (M-mode): 2.5 cm LEFT ATRIUM             Index        RIGHT ATRIUM           Index LA diam:        3.50 cm 1.80 cm/m   RA Area:     17.30 cm LA Vol (A2C):   45.3 ml 23.31 ml/m  RA Volume:   43.50 ml  22.39 ml/m LA Vol (A4C):   42.9 ml 22.08 ml/m LA Biplane Vol: 47.2 ml 24.29 ml/m  AORTIC VALVE AV Area (Vmax): 2.12 cm AV Vmax:        140.00 cm/s AV Peak Grad:   7.8 mmHg LVOT Vmax:      94.60 cm/s LVOT Vmean:     66.800 cm/s LVOT VTI:       0.213 m  AORTA Ao Root diam: 2.80 cm Ao Asc diam:  3.20 cm MITRAL VALVE MV Area (PHT): 3.21 cm    SHUNTS MV Decel Time: 236 msec    Systemic VTI:  0.21 m MV E velocity: 61.30 cm/s  Systemic Diam: 2.00 cm MV A velocity: 64.70 cm/s MV E/A ratio:  0.95 Kardie Tobb DO Electronically signed by Thomasene Ripple DO Signature Date/Time: 06/04/2021/5:54:07 PM    Final    VAS [place] LOWER EXTREMITY VENOUS (DVT)  Result Date: 06/04/2021  Lower Venous DVT Study Patient Name:  Garrett Holt  Date of Exam:   06/04/2021 Medical Rec #: [id number]  Accession #:     [id number] Date of Birth: [birth year]  Patient Gender: M Patient Age:   75 years Exam Location:  [facility] Procedure:      VAS [place] LOWER EXTREMITY VENOUS (DVT) Referring Phys: Lyda Perone --------------------------------------------------------------------------------  Indications: Covid, and pulmonary embolism.  Comparison Study: Prior negative left LEV done 06/19/18 Performing Technologist: Sherren Kerns RVS  Examination Guidelines: A complete evaluation includes B-mode imaging, spectral Doppler, color Doppler, and power Doppler as needed of all accessible portions of each vessel. Bilateral testing is considered an integral part of a complete examination. Limited examinations for reoccurring indications may be performed as noted. The reflux portion of the exam is performed with the patient in reverse Trendelenburg.  +---------+---------------+---------+-----------+----------+--------------+ RIGHT    CompressibilityPhasicitySpontaneityPropertiesThrombus Aging +---------+---------------+---------+-----------+----------+--------------+ CFV      Full           Yes      Yes                                 +---------+---------------+---------+-----------+----------+--------------+ SFJ      Full                                                        +---------+---------------+---------+-----------+----------+--------------+ FV Prox  Full                                                        +---------+---------------+---------+-----------+----------+--------------+  FV Mid   Full                                                        +---------+---------------+---------+-----------+----------+--------------+ FV DistalFull                                                        +---------+---------------+---------+-----------+----------+--------------+ PFV      Full                                                         +---------+---------------+---------+-----------+----------+--------------+ POP      Full           Yes      Yes                                 +---------+---------------+---------+-----------+----------+--------------+ PTV      Full                                                        +---------+---------------+---------+-----------+----------+--------------+ PERO     Full                                                        +---------+---------------+---------+-----------+----------+--------------+   +---------+---------------+---------+-----------+----------+--------------+ LEFT     CompressibilityPhasicitySpontaneityPropertiesThrombus Aging +---------+---------------+---------+-----------+----------+--------------+ CFV      Full           Yes      Yes                                 +---------+---------------+---------+-----------+----------+--------------+ SFJ      Full                                                        +---------+---------------+---------+-----------+----------+--------------+ FV Prox  Full                                                        +---------+---------------+---------+-----------+----------+--------------+ FV Mid   Full                                                        +---------+---------------+---------+-----------+----------+--------------+  FV DistalFull                                                        +---------+---------------+---------+-----------+----------+--------------+ PFV      Full                                                        +---------+---------------+---------+-----------+----------+--------------+ POP      Full           No       No                   Rouleaux flow  +---------+---------------+---------+-----------+----------+--------------+ PTV      Full                                                         +---------+---------------+---------+-----------+----------+--------------+ PERO     Full                                                        +---------+---------------+---------+-----------+----------+--------------+     Summary: BILATERAL: - No evidence of deep vein thrombosis seen in the lower extremities, bilaterally. -No evidence of popliteal cyst, bilaterally. RIGHT: Rouleaux flow noted  LEFT: Rouleaux flow noted.  *See table(s) above for measurements and observations. Electronically signed by Sherald Hess MD on 06/04/2021 at 4:41:02 PM.    Final       Subjective: Feels well, chest pain in right lower chest is nearly resolved. No dyspnea even with exertion any more. No fever. No leg swelling. No bleeding. Wants to go home.   Discharge Exam: Vitals:   06/05/21 0640 06/05/21 0743  BP: 134/88 (!) 146/86  Pulse: 79 65  Resp: 18 18  Temp: 98 F (36.7 C) (!) 97.3 F (36.3 C)  SpO2: 99% 98%   General: Pt is alert, awake, not in acute distress Cardiovascular: RRR, S1/S2 +, no rubs, no gallops Respiratory: CTA bilaterally, no wheezing, no rhonchi Abdominal: Soft, NT, ND, bowel sounds + Extremities: No edema, no cyanosis  Labs: BNP (last 3 results) Recent Labs    06/02/21 1834  BNP 22.7   Basic Metabolic Panel: Recent Labs  Lab 06/02/21 1834 06/03/21 0512 06/04/21 0131 06/05/21 0248  NA 135 133* 135 134*  K 3.9 4.1 4.3 4.5  CL 101 100 102 100  CO2 24 24 24 24   GLUCOSE 104* 156* 158* 131*  BUN 13 10 17  26*  CREATININE 0.91 0.90 0.97 1.01  CALCIUM 9.1 8.7* 9.2 9.4   Liver Function Tests: Recent Labs  Lab 06/03/21 0512 06/04/21 0131 06/05/21 0248  AST 22 21 33  ALT 29 25 34  ALKPHOS 99 95 100  BILITOT 0.3 0.5 0.4  PROT 7.3 7.8 7.6  ALBUMIN 3.3* 3.2* 3.3*   No results for input(s): LIPASE, AMYLASE in the  last 168 hours. No results for input(s): AMMONIA in the last 168 hours. CBC: Recent Labs  Lab 06/02/21 1834 06/03/21 0226 06/03/21 0512  06/04/21 0131  WBC 10.0 9.3 10.0 12.5*  NEUTROABS  --   --  8.9*  --   HGB 13.8 12.9* 13.2 13.7  HCT 42.5 39.7 39.8 41.5  MCV 86.9 86.5 84.9 84.3  PLT 261 250 254 322   Cardiac Enzymes: No results for input(s): CKTOTAL, CKMB, CKMBINDEX, TROPONINI in the last 168 hours. BNP: Invalid input(s): POCBNP CBG: No results for input(s): GLUCAP in the last 168 hours. D-Dimer Recent Labs    06/03/21 0512  DDIMER 2.12*   Hgb A1c No results for input(s): HGBA1C in the last 72 hours. Lipid Profile No results for input(s): CHOL, HDL, LDLCALC, TRIG, CHOLHDL, LDLDIRECT in the last 72 hours. Thyroid function studies No results for input(s): TSH, T4TOTAL, T3FREE, THYROIDAB in the last 72 hours.  Invalid input(s): FREET3 Anemia work up No results for input(s): VITAMINB12, FOLATE, FERRITIN, TIBC, IRON, RETICCTPCT in the last 72 hours. Urinalysis No results found for: COLORURINE, APPEARANCEUR, LABSPEC, PHURINE, GLUCOSEU, HGBUR, BILIRUBINUR, KETONESUR, PROTEINUR, UROBILINOGEN, NITRITE, LEUKOCYTESUR  Microbiology Recent Results (from the past 240 hour(s))  Resp Panel by RT-PCR (Flu A&B, Covid) Nasopharyngeal Swab     Status: Abnormal   Collection Time: 06/02/21  6:34 PM   Specimen: Nasopharyngeal Swab; Nasopharyngeal(NP) swabs in vial transport medium  Result Value Ref Range Status   SARS Coronavirus 2 by RT PCR POSITIVE (A) NEGATIVE Final    Comment: RESULT CALLED TO, READ BACK BY AND VERIFIED WITH:  POWELL, V RN  06/02/21 EDENSCA (NOTE) SARS-CoV-2 target nucleic acids are DETECTED.  The SARS-CoV-2 RNA is generally detectable in upper respiratory specimens during the acute phase of infection. Positive results are indicative of the presence of the identified virus, but do not rule out bacterial infection or co-infection with other pathogens not detected by the test. Clinical correlation with patient history and other diagnostic information is necessary to determine patient infection  status. The expected result is Negative.  Fact Sheet for Patients: BloggerCourse.com  Fact Sheet for Healthcare Providers: SeriousBroker.it  This test is not yet approved or cleared by the [place] FDA and  has been authorized for detection and/or diagnosis of SARS-CoV-2 by FDA under an Emergency Use Authorization (EUA).  This EUA will remain in effect (meaning this test can  be used) for the duration of  the COVID-19 declaration under Section 564(b)(1) of the Act, 21 U.S.C. section 360bbb-3(b)(1), unless the authorization is terminated or revoked sooner.     Influenza A by PCR NEGATIVE NEGATIVE Final   Influenza B by PCR NEGATIVE NEGATIVE Final    Comment: (NOTE) The Xpert Xpress SARS-CoV-2/FLU/RSV plus assay is intended as an aid in the diagnosis of influenza from Nasopharyngeal swab specimens and should not be used as a sole basis for treatment. Nasal washings and aspirates are unacceptable for Xpert Xpress SARS-CoV-2/FLU/RSV testing.  Fact Sheet for Patients: BloggerCourse.com  Fact Sheet for Healthcare Providers: SeriousBroker.it  This test is not yet approved or cleared by the [place] FDA and has been authorized for detection and/or diagnosis of SARS-CoV-2 by FDA under an Emergency Use Authorization (EUA). This EUA will remain in effect (meaning this test can be used) for the duration of the COVID-19 declaration under Section 564(b)(1) of the Act, 21 U.S.C. section 360bbb-3(b)(1), unless the authorization is terminated or revoked.  Performed at [facility], [address] [place], [place] [address]     Time coordinating discharge: Approximately 40 minutes  Tyrone Nine, MD  [facility] 06/05/2021, 11:24 AM

## 2021-06-21 ENCOUNTER — Other Ambulatory Visit (HOSPITAL_COMMUNITY): Payer: Self-pay

## 2022-11-17 IMAGING — DX DG CHEST 1V PORT
1 series · 1 of 1 positions shown · non-contrast
Comparison: None.

CLINICAL DATA: Cough and chest pain.

EXAM:
PORTABLE CHEST 1 VIEW

[chest ap]
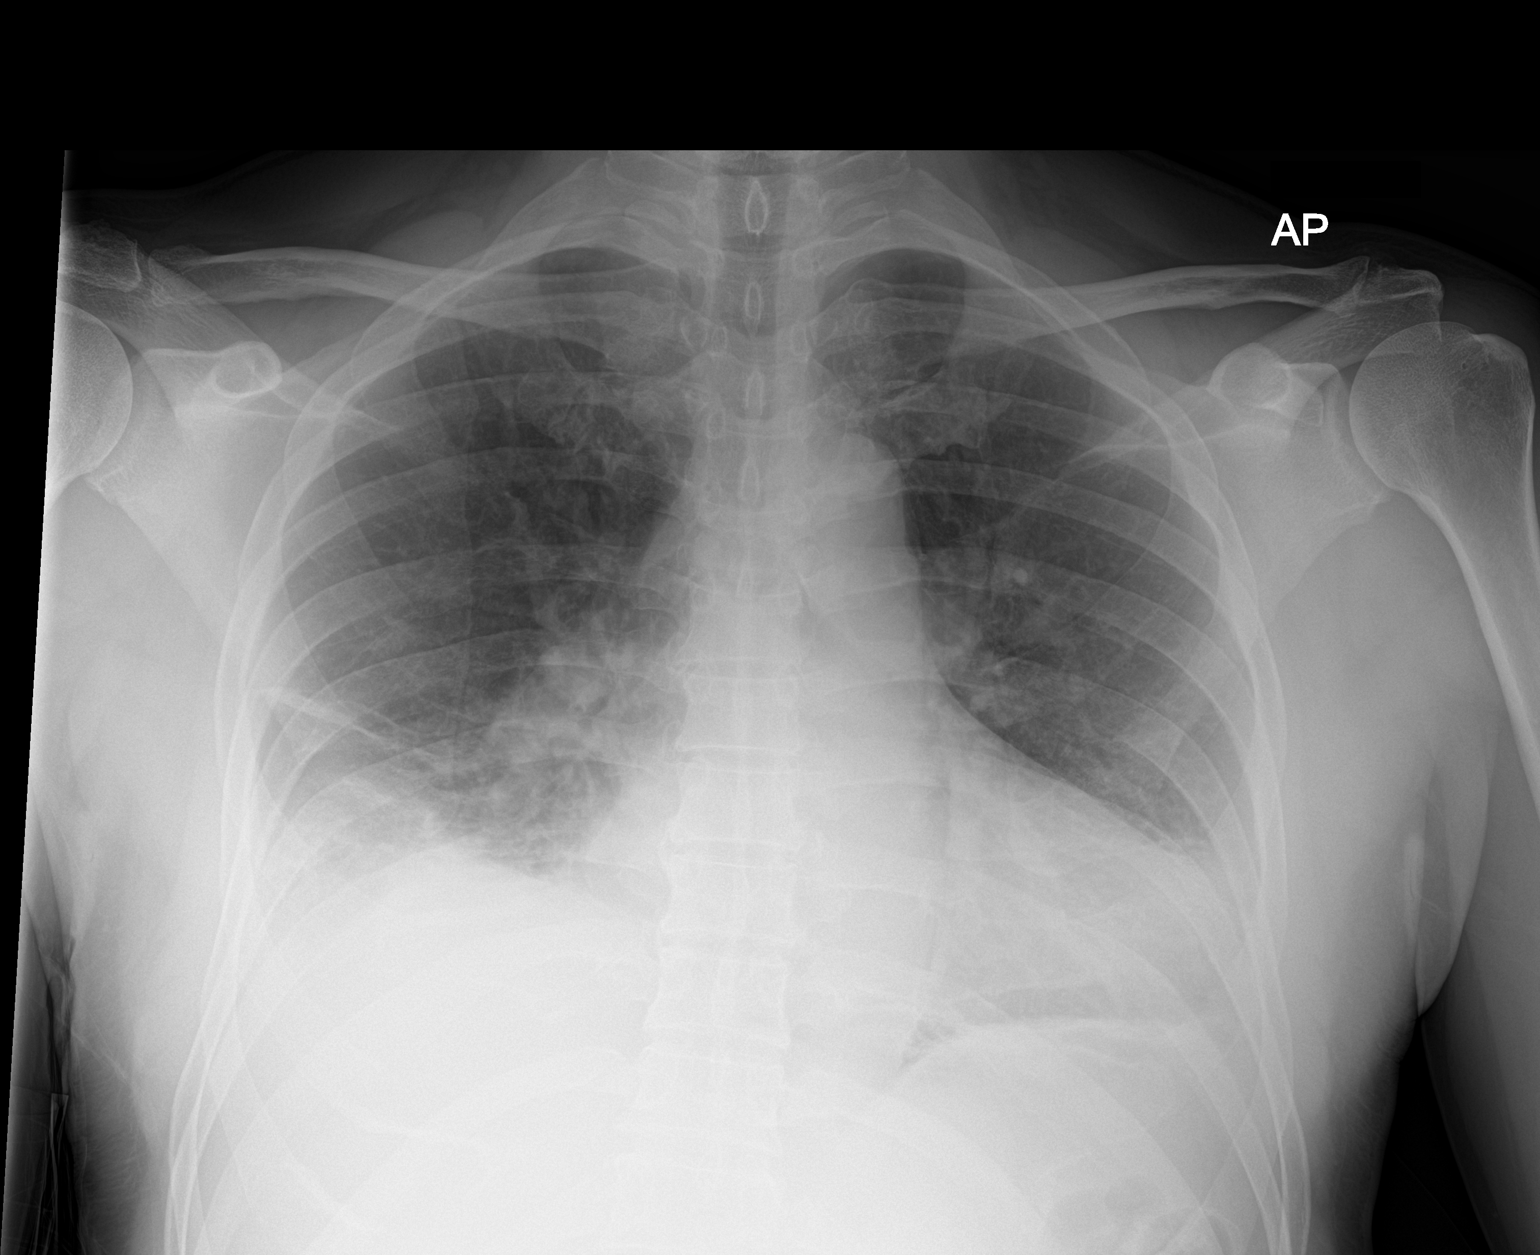

[1 of 1 positions shown; findings below may reference images not displayed]

FINDINGS: The heart is mildly enlarged. There are small bilateral pleural
effusions. There is some strandy opacities in both lung bases. There
is no pneumothorax or acute fracture.
IMPRESSION: 1. Cardiomegaly.
2. Small bilateral pleural effusions with bibasilar
atelectasis/airspace disease.
